# Patient Record
Sex: Female | Born: 1980
Health system: Southern US, Community
[De-identification: ages and names within clinical notes are randomized; demographics above are authoritative.]

## PROBLEM LIST (undated history)

## (undated) DIAGNOSIS — N970 Female infertility associated with anovulation: Secondary | ICD-10-CM

## (undated) DIAGNOSIS — E559 Vitamin D deficiency, unspecified: Secondary | ICD-10-CM

## (undated) DIAGNOSIS — E66813 Obesity, class 3: Secondary | ICD-10-CM

## (undated) DIAGNOSIS — Z8619 Personal history of other infectious and parasitic diseases: Secondary | ICD-10-CM

## (undated) DIAGNOSIS — R87629 Unspecified abnormal cytological findings in specimens from vagina: Secondary | ICD-10-CM

## (undated) DIAGNOSIS — R87612 Low grade squamous intraepithelial lesion on cytologic smear of cervix (LGSIL): Secondary | ICD-10-CM

## (undated) DIAGNOSIS — N6091 Unspecified benign mammary dysplasia of right breast: Secondary | ICD-10-CM

## (undated) HISTORY — DX: Personal history of other infectious and parasitic diseases: Z86.19

## (undated) HISTORY — DX: Unspecified abnormal cytological findings in specimens from vagina: R87.629

## (undated) HISTORY — DX: Morbid (severe) obesity due to excess calories: E66.01

## (undated) HISTORY — DX: Low grade squamous intraepithelial lesion on cytologic smear of cervix (LGSIL): R87.612

## (undated) HISTORY — DX: Unspecified benign mammary dysplasia of right breast: N60.91

## (undated) HISTORY — PX: BREAST BIOPSY: SHX20

## (undated) HISTORY — DX: Female infertility associated with anovulation: N97.0

## (undated) HISTORY — DX: Obesity, class 3: E66.813

## (undated) HISTORY — DX: Vitamin D deficiency, unspecified: E55.9

---

## 2000-07-04 HISTORY — PX: INDUCED ABORTION: SHX677

## 2003-06-23 ENCOUNTER — Other Ambulatory Visit: Admission: RE | Admit: 2003-06-23 | Discharge: 2003-06-23 | Payer: Self-pay | Admitting: Obstetrics and Gynecology

## 2004-08-05 ENCOUNTER — Other Ambulatory Visit: Admission: RE | Admit: 2004-08-05 | Discharge: 2004-08-05 | Payer: Self-pay | Admitting: Obstetrics and Gynecology

## 2005-09-01 ENCOUNTER — Other Ambulatory Visit: Admission: RE | Admit: 2005-09-01 | Discharge: 2005-09-01 | Payer: Self-pay | Admitting: Obstetrics and Gynecology

## 2011-07-05 DIAGNOSIS — R87612 Low grade squamous intraepithelial lesion on cytologic smear of cervix (LGSIL): Secondary | ICD-10-CM

## 2011-07-05 HISTORY — DX: Low grade squamous intraepithelial lesion on cytologic smear of cervix (LGSIL): R87.612

## 2011-09-12 LAB — HM PAP SMEAR

## 2012-09-03 LAB — HM PAP SMEAR: HM Pap smear: NORMAL

## 2013-05-07 LAB — COMPLETE METABOLIC PANEL WITH GFR
ALT: 14
AST: 26 U/L
Alkaline Phosphatase: 140 U/L
BILIRUBIN, TOTAL: 0.4
Creat: 75

## 2013-05-07 LAB — LIPID PANEL
Cholesterol: 203
HDL: 67 mg/dL (ref 35–70)
LDL (calc): 126
TRIGLYCERIDES: 52

## 2013-05-07 LAB — VITAMIN D 25 HYDROXY (VIT D DEFICIENCY, FRACTURES): VIT D 25 HYDROXY: 16.7

## 2013-05-07 LAB — CBC
HEMOGLOBIN: 12.4 g/dL
PLATELET COUNT: 300
WBC: 6.1

## 2013-05-07 LAB — TSH: TSH: 2.88

## 2013-05-07 LAB — HEMOGLOBIN A1C: A1C: 5.5

## 2014-07-04 HISTORY — PX: WISDOM TOOTH EXTRACTION: SHX21

## 2015-03-17 LAB — COMPLETE METABOLIC PANEL WITH GFR
ALK PHOS: 121 U/L
ALT: 10
AST: 14 U/L
Albumin: 3.9
BILIRUBIN, TOTAL: 0.4
Creat: 1.18
Glucose: 84
POTASSIUM: 4.3 mmol/L
Sodium: 140

## 2015-03-17 LAB — LIPID PANEL
CHOLESTEROL: 197
HDL: 51
LDL (calc): 136
TRIGLYCERIDES: 50

## 2015-03-17 LAB — INSULIN, RANDOM: INSULIN: 12.5

## 2015-03-17 LAB — TSH: TSH: 3.31

## 2015-03-17 LAB — TESTOSTERONE, FREE, DIRECT: TESTOSTERONE FREE: 1.9

## 2015-03-17 LAB — PROLACTIN: PROLACTIN: 7.8

## 2015-03-17 LAB — CBC
HEMOGLOBIN: 11.6 g/dL
WBC: 5.9
platelet count: 297

## 2015-03-17 LAB — GLUCOSE, FASTING: Glucose: 84

## 2015-04-23 LAB — CHLAMYDIA/GC NAA, CONFIRMATION
Chlamydia trachomatis, NAA: NEGATIVE
Neisseria gonorrhoeae, NAA: NEGATIVE

## 2015-04-23 LAB — HEPATITIS C ANTIBODY

## 2015-04-23 LAB — HIV GENOSURE REFLX (INTGR4): HIV 1&2 Ab, 4th Generation: NONREACTIVE

## 2015-04-23 LAB — RPR: RPR: NONREACTIVE

## 2015-06-30 LAB — FOLLICLE STIMULATING HORMONE: FSH: 72.7

## 2015-12-25 ENCOUNTER — Ambulatory Visit: Payer: Self-pay | Admitting: Family Medicine

## 2015-12-29 ENCOUNTER — Encounter (INDEPENDENT_AMBULATORY_CARE_PROVIDER_SITE_OTHER): Payer: Self-pay

## 2015-12-29 ENCOUNTER — Ambulatory Visit (INDEPENDENT_AMBULATORY_CARE_PROVIDER_SITE_OTHER): Payer: 59 | Admitting: Family Medicine

## 2015-12-29 ENCOUNTER — Encounter: Payer: Self-pay | Admitting: Family Medicine

## 2015-12-29 VITALS — BP 140/70 | HR 84 | Temp 98.3°F | Ht 64.5 in | Wt 278.0 lb

## 2015-12-29 DIAGNOSIS — R0683 Snoring: Secondary | ICD-10-CM | POA: Diagnosis not present

## 2015-12-29 DIAGNOSIS — G4733 Obstructive sleep apnea (adult) (pediatric): Secondary | ICD-10-CM | POA: Insufficient documentation

## 2015-12-29 DIAGNOSIS — Z0000 Encounter for general adult medical examination without abnormal findings: Secondary | ICD-10-CM | POA: Insufficient documentation

## 2015-12-29 DIAGNOSIS — Z9989 Dependence on other enabling machines and devices: Secondary | ICD-10-CM

## 2015-12-29 DIAGNOSIS — Z6841 Body Mass Index (BMI) 40.0 and over, adult: Secondary | ICD-10-CM | POA: Insufficient documentation

## 2015-12-29 HISTORY — DX: Obstructive sleep apnea (adult) (pediatric): G47.33

## 2015-12-29 NOTE — Assessment & Plan Note (Signed)
Preventative protocols reviewed and updated unless pt declined. Discussed healthy diet and lifestyle.  

## 2015-12-29 NOTE — Addendum Note (Signed)
Addended by: Eustaquio BoydenGUTIERREZ, Daryle Amis on: 12/29/2015 11:15 PM   Modules accepted: Orders

## 2015-12-29 NOTE — Progress Notes (Signed)
Pre visit review using our clinic review tool, if applicable. No additional management support is needed unless otherwise documented below in the visit note. 

## 2015-12-29 NOTE — Assessment & Plan Note (Addendum)
Discussed healthy diet and lifestyle changes to affect sustainable weight loss.  Pt motivated to continue healthy changes already implemented.  Requests nutritionist referral.  RTC 3 mo f/u visit.

## 2015-12-29 NOTE — Progress Notes (Addendum)
BP 140/70 mmHg  Pulse 84  Temp(Src) 98.3 F (36.8 C) (Oral)  Ht 5' 4.5" (1.638 m)  Wt 278 lb (126.1 kg)  BMI 47.00 kg/m2  SpO2 99%   CC: new pt to establish  Subjective:    Patient ID: Courtney Jones, female    DOB: 11/22/1980, 35 y.o.   MRN: [id number]  HPI: Courtney Jones is a 35 y.o. female presenting on 12/29/2015 for Establish Care   Obesity - working on weight loss. Cut out sodas over last 2 weeks. Working on more prepared meals at home. Working on portion sizes. Working out 3 times weekly at gym.   1 month ago completed abx for ear infection.   Occasional choking at night time. + snoring. + PNdyspnea. Overall restorative sleep. No significant daytime sleepiness.   LMP 04/2015. Very irregular. Prior on progesterone.   Preventative: Well woman - normal paps, h/o HPV. OBGYN Dr Senaida Oresichardson. 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 Interim medical history since our last visit reviewed. Allergies and medications reviewed and updated. No current outpatient prescriptions on file prior to visit.   No current facility-administered medications on file prior to visit.    Review of Systems  Constitutional: Negative for fever, chills, activity change, appetite change, fatigue and unexpected weight change.  HENT: Negative for hearing loss.   Eyes: Negative for visual disturbance.  Respiratory: Negative for cough, chest tightness, shortness of breath and wheezing.   Cardiovascular: Negative for chest pain, palpitations and leg swelling.  Gastrointestinal: Negative for nausea, vomiting, abdominal pain, diarrhea,  constipation, blood in stool and abdominal distention.  Genitourinary: Negative for hematuria and difficulty urinating.  Musculoskeletal: Negative for myalgias, arthralgias and neck pain.  Skin: Negative for rash.  Neurological: Negative for dizziness, seizures, syncope and headaches.  Hematological: Negative for adenopathy. Does not bruise/bleed easily.  Psychiatric/Behavioral: Negative for dysphoric mood. The patient is not nervous/anxious.    Per HPI unless specifically indicated in ROS section     Objective:    BP 140/70 mmHg  Pulse 84  Temp(Src) 98.3 F (36.8 C) (Oral)  Ht 5' 4.5" (1.638 m)  Wt 278 lb (126.1 kg)  BMI 47.00 kg/m2  SpO2 99%  Wt Readings from Last 3 Encounters:  12/29/15 278 lb (126.1 kg)    Physical Exam  Constitutional: She is oriented to person, place, and time. She appears well-developed and well-nourished. No distress.  HENT:  Head: Normocephalic and atraumatic.  Right Ear: Hearing, tympanic membrane, external ear and ear canal normal.  Left Ear: Hearing, tympanic membrane, external ear and ear canal normal.  Nose: Nose normal.  Mouth/Throat: Uvula is midline, oropharynx is clear and moist and mucous membranes are normal. No oropharyngeal exudate, posterior oropharyngeal edema or posterior oropharyngeal erythema.  Eyes: Conjunctivae and EOM are normal. Pupils are equal, round, and reactive to light. No scleral icterus.  Neck: Normal range of motion. Neck supple. No thyromegaly present.  Cardiovascular: Normal rate, regular rhythm, normal heart sounds and intact distal pulses.   No murmur heard. Pulses:      Radial pulses are 2+ on the right side, and 2+ on the left side.  Pulmonary/Chest: Effort  normal and breath sounds normal. No respiratory distress. She has no wheezes. She has no rales.  Abdominal: Soft. Bowel sounds are normal. She exhibits no distension and no mass. There is no tenderness. There is no rebound and no guarding.  Musculoskeletal:  Normal range of motion. She exhibits no edema.  Lymphadenopathy:    She has no cervical adenopathy.  Neurological: She is alert and oriented to person, place, and time.  CN grossly intact, station and gait intact  Skin: Skin is warm and dry. No rash noted.  Psychiatric: She has a normal mood and affect. Her behavior is normal. Judgment and thought content normal.  Nursing note and vitals reviewed.  No results found for this or any previous visit.    Assessment & Plan:   Problem List Items Addressed This Visit    Obesity, Class III, BMI 40-49.9 (morbid obesity) (HCC)    Discussed healthy diet and lifestyle changes to affect sustainable weight loss.  Pt motivated to continue healthy changes already implemented.  Requests nutritionist referral.  RTC 3 mo f/u visit.       Relevant Orders   Amb ref to Medical Nutrition Therapy-MNT   Lipid panel   Comprehensive metabolic panel   TSH   Health maintenance examination - Primary    Preventative protocols reviewed and updated unless pt declined. Discussed healthy diet and lifestyle.       Snoring    ESS today = 13. Will refer to pulm to discuss possible OSA.  Pt motivated to make healthy changes for weight loss.       Relevant Orders   Ambulatory referral to Pulmonology       Follow up plan: Return in about 3 months (around 03/30/2016) for follow up visit.  Eustaquio BoydenJavier Santia Labate, MD

## 2015-12-29 NOTE — Patient Instructions (Addendum)
Look into Center for [facility] in [place] [phone] You are doing well today. Continue healthy diet and lifestyle changes. Return as needed or in 3 months for follow up visit.  Return at your convenience for fasting labs one morning.   Health Maintenance, Female Adopting a healthy lifestyle and getting preventive care can go a long way to promote health and wellness. Talk with your health care provider about what schedule of regular examinations is right for you. This is a good chance for you to check in with your provider about disease prevention and staying healthy. In between checkups, there are plenty of things you can do on your own. Experts have done a lot of research about which lifestyle changes and preventive measures are most likely to keep you healthy. Ask your health care provider for more information. WEIGHT AND DIET  Eat a healthy diet  Be sure to include plenty of vegetables, fruits, low-fat dairy products, and lean protein.  Do not eat a lot of foods high in solid fats, added sugars, or salt.  Get regular exercise. This is one of the most important things you can do for your health.  Most adults should exercise for at least 150 minutes each week. The exercise should increase your heart rate and make you sweat (moderate-intensity exercise).  Most adults should also do strengthening exercises at least twice a week. This is in addition to the moderate-intensity exercise.  Maintain a healthy weight  Body mass index (BMI) is a measurement that can be used to identify possible weight problems. It estimates body fat based on height and weight. Your health care provider can help determine your BMI and help you achieve or maintain a healthy weight.  For females 54 years of age and older:   A BMI below 18.5 is considered underweight.  A BMI of 18.5 to 24.9 is normal.  A BMI of 25 to 29.9 is considered overweight.  A BMI of 30 and above is considered obese.  Watch  levels of cholesterol and blood lipids  You should start having your blood tested for lipids and cholesterol at 36 years of age, then have this test every 5 years.  You may need to have your cholesterol levels checked more often if:  Your lipid or cholesterol levels are high.  You are older than 36 years of age.  You are at high risk for heart disease.  CANCER SCREENING   Lung Cancer  Lung cancer screening is recommended for adults 81-59 years old who are at high risk for lung cancer because of a history of smoking.  A yearly low-dose CT scan of the lungs is recommended for people who:  Currently smoke.  Have quit within the past 15 years.  Have at least a 30-pack-year history of smoking. A pack year is smoking an average of one pack of cigarettes a day for 1 year.  Yearly screening should continue until it has been 15 years since you quit.  Yearly screening should stop if you develop a health problem that would prevent you from having lung cancer treatment.  Breast Cancer  Practice breast self-awareness. This means understanding how your breasts normally appear and feel.  It also means doing regular breast self-exams. Let your health care provider know about any changes, no matter how small.  If you are in your 20s or 30s, you should have a clinical breast exam (CBE) by a health care provider every 1-3 years as part of a regular health  exam.  If you are 40 or older, have a CBE every year. Also consider having a breast X-ray (mammogram) every year.  If you have a family history of breast cancer, talk to your health care provider about genetic screening.  If you are at high risk for breast cancer, talk to your health care provider about having an MRI and a mammogram every year.  Breast cancer gene (BRCA) assessment is recommended for women who have family members with BRCA-related cancers. BRCA-related cancers include:  Breast.  Ovarian.  Tubal.  Peritoneal  cancers.  Results of the assessment will determine the need for genetic counseling and BRCA1 and BRCA2 testing. Cervical Cancer Your health care provider may recommend that you be screened regularly for cancer of the pelvic organs (ovaries, uterus, and vagina). This screening involves a pelvic examination, including checking for microscopic changes to the surface of your cervix (Pap test). You may be encouraged to have this screening done every 3 years, beginning at age 41.  For women ages 38-65, health care providers may recommend pelvic exams and Pap testing every 3 years, or they may recommend the Pap and pelvic exam, combined with testing for human papilloma virus (HPV), every 5 years. Some types of HPV increase your risk of cervical cancer. Testing for HPV may also be done on women of any age with unclear Pap test results.  Other health care providers may not recommend any screening for nonpregnant women who are considered low risk for pelvic cancer and who do not have symptoms. Ask your health care provider if a screening pelvic exam is right for you.  If you have had past treatment for cervical cancer or a condition that could lead to cancer, you need Pap tests and screening for cancer for at least 20 years after your treatment. If Pap tests have been discontinued, your risk factors (such as having a new sexual partner) need to be reassessed to determine if screening should resume. Some women have medical problems that increase the chance of getting cervical cancer. In these cases, your health care provider may recommend more frequent screening and Pap tests. Colorectal Cancer  This type of cancer can be detected and often prevented.  Routine colorectal cancer screening usually begins at 36 years of age and continues through 36 years of age.  Your health care provider may recommend screening at an earlier age if you have risk factors for colon cancer.  Your health care provider may also  recommend using home test kits to check for hidden blood in the stool.  A small camera at the end of a tube can be used to examine your colon directly (sigmoidoscopy or colonoscopy). This is done to check for the earliest forms of colorectal cancer.  Routine screening usually begins at age 10.  Direct examination of the colon should be repeated every 5-10 years through 36 years of age. However, you may need to be screened more often if early forms of precancerous polyps or small growths are found. Skin Cancer  Check your skin from head to toe regularly.  Tell your health care provider about any new moles or changes in moles, especially if there is a change in a mole's shape or color.  Also tell your health care provider if you have a mole that is larger than the size of a pencil eraser.  Always use sunscreen. Apply sunscreen liberally and repeatedly throughout the day.  Protect yourself by wearing long sleeves, pants, a wide-brimmed hat, and sunglasses  whenever you are outside. HEART DISEASE, DIABETES, AND HIGH BLOOD PRESSURE   High blood pressure causes heart disease and increases the risk of stroke. High blood pressure is more likely to develop in:  People who have blood pressure in the high end of the normal range (130-139/85-89 mm Hg).  People who are overweight or obese.  People who are African American.  If you are 47-41 years of age, have your blood pressure checked every 3-5 years. If you are 33 years of age or older, have your blood pressure checked every year. You should have your blood pressure measured twice--once when you are at a hospital or clinic, and once when you are not at a hospital or clinic. Record the average of the two measurements. To check your blood pressure when you are not at a hospital or clinic, you can use:  An automated blood pressure machine at a pharmacy.  A home blood pressure monitor.  If you are between 17 years and 73 years old, ask your health  care provider if you should take aspirin to prevent strokes.  Have regular diabetes screenings. This involves taking a blood sample to check your fasting blood sugar level.  If you are at a normal weight and have a low risk for diabetes, have this test once every three years after 36 years of age.  If you are overweight and have a high risk for diabetes, consider being tested at a younger age or more often. PREVENTING INFECTION  Hepatitis B  If you have a higher risk for hepatitis B, you should be screened for this virus. You are considered at high risk for hepatitis B if:  You were born in a country where hepatitis B is common. Ask your health care provider which countries are considered high risk.  Your parents were born in a high-risk country, and you have not been immunized against hepatitis B (hepatitis B vaccine).  You have HIV or AIDS.  You use needles to inject street drugs.  You live with someone who has hepatitis B.  You have had sex with someone who has hepatitis B.  You get hemodialysis treatment.  You take certain medicines for conditions, including cancer, organ transplantation, and autoimmune conditions. Hepatitis C  Blood testing is recommended for:  Everyone born from [birth year] through 03/14/1965.  Anyone with known risk factors for hepatitis C. Sexually transmitted infections (STIs)  You should be screened for sexually transmitted infections (STIs) including gonorrhea and chlamydia if:  You are sexually active and are younger than 36 years of age.  You are older than 36 years of age and your health care provider tells you that you are at risk for this type of infection.  Your sexual activity has changed since you were last screened and you are at an increased risk for chlamydia or gonorrhea. Ask your health care provider if you are at risk.  If you do not have HIV, but are at risk, it may be recommended that you take a prescription medicine daily to prevent HIV  infection. This is called pre-exposure prophylaxis (PrEP). You are considered at risk if:  You are sexually active and do not regularly use condoms or know the HIV status of your partner(s).  You take drugs by injection.  You are sexually active with a partner who has HIV. Talk with your health care provider about whether you are at high risk of being infected with HIV. If you choose to begin PrEP, you should first be  tested for HIV. You should then be tested every 3 months for as long as you are taking PrEP.  PREGNANCY   If you are premenopausal and you may become pregnant, ask your health care provider about preconception counseling.  If you may become pregnant, take 400 to 800 micrograms (mcg) of folic acid every day.  If you want to prevent pregnancy, talk to your health care provider about birth control (contraception). OSTEOPOROSIS AND MENOPAUSE   Osteoporosis is a disease in which the bones lose minerals and strength with aging. This can result in serious bone fractures. Your risk for osteoporosis can be identified using a bone density scan.  If you are 25 years of age or older, or if you are at risk for osteoporosis and fractures, ask your health care provider if you should be screened.  Ask your health care provider whether you should take a calcium or vitamin D supplement to lower your risk for osteoporosis.  Menopause may have certain physical symptoms and risks.  Hormone replacement therapy may reduce some of these symptoms and risks. Talk to your health care provider about whether hormone replacement therapy is right for you.  HOME CARE INSTRUCTIONS   Schedule regular health, dental, and eye exams.  Stay current with your immunizations.   Do not use any tobacco products including cigarettes, chewing tobacco, or electronic cigarettes.  If you are pregnant, do not drink alcohol.  If you are breastfeeding, limit how much and how often you drink alcohol.  Limit  alcohol intake to no more than 1 drink per day for nonpregnant women. One drink equals 12 ounces of beer, 5 ounces of wine, or 1 ounces of hard liquor.  Do not use street drugs.  Do not share needles.  Ask your health care provider for help if you need support or information about quitting drugs.  Tell your health care provider if you often feel depressed.  Tell your health care provider if you have ever been abused or do not feel safe at home.   This information is not intended to replace advice given to you by your health care provider. Make sure you discuss any questions you have with your health care provider.   Document Released: 01/03/2011 Document Revised: 07/11/2014 Document Reviewed: 05/22/2013 Elsevier Interactive Patient Education [employer].

## 2015-12-29 NOTE — Assessment & Plan Note (Signed)
ESS today = 13. Will refer to pulm to discuss possible OSA.  Pt motivated to make healthy changes for weight loss.

## 2015-12-31 ENCOUNTER — Other Ambulatory Visit (INDEPENDENT_AMBULATORY_CARE_PROVIDER_SITE_OTHER): Payer: 59

## 2015-12-31 LAB — COMPREHENSIVE METABOLIC PANEL
ALK PHOS: 94 U/L (ref 39–117)
ALT: 17 U/L (ref 0–35)
AST: 23 U/L (ref 0–37)
Albumin: 3.9 g/dL (ref 3.5–5.2)
BUN: 16 mg/dL (ref 6–23)
CHLORIDE: 104 meq/L (ref 96–112)
CO2: 30 meq/L (ref 19–32)
Calcium: 9.6 mg/dL (ref 8.4–10.5)
Creatinine, Ser: 1.29 mg/dL — ABNORMAL HIGH (ref 0.40–1.20)
GFR: 49.61 mL/min — AB (ref >60.00)
GLUCOSE: 83 mg/dL (ref 70–99)
POTASSIUM: 4.1 meq/L (ref 3.5–5.1)
SODIUM: 139 meq/L (ref 135–145)
TOTAL PROTEIN: 7.9 g/dL (ref 6.0–8.3)
Total Bilirubin: 0.4 mg/dL (ref 0.2–1.2)

## 2015-12-31 LAB — LIPID PANEL
CHOL/HDL RATIO: 3
Cholesterol: 189 mg/dL (ref 0–200)
HDL: 55.6 mg/dL (ref >39.00)
LDL CALC: 125 mg/dL — AB (ref 0–99)
NONHDL: 133.34
Triglycerides: 42 mg/dL (ref 0.0–149.0)
VLDL: 8.4 mg/dL (ref 0.0–40.0)

## 2015-12-31 LAB — TSH: TSH: 2.49 u[IU]/mL (ref 0.35–4.50)

## 2016-01-03 ENCOUNTER — Encounter: Payer: Self-pay | Admitting: Family Medicine

## 2016-01-06 ENCOUNTER — Encounter: Payer: Self-pay | Admitting: *Deleted

## 2016-01-10 ENCOUNTER — Encounter: Payer: Self-pay | Admitting: Family Medicine

## 2016-01-10 DIAGNOSIS — E559 Vitamin D deficiency, unspecified: Secondary | ICD-10-CM | POA: Insufficient documentation

## 2016-01-29 ENCOUNTER — Encounter: Payer: Self-pay | Admitting: Pulmonary Disease

## 2016-01-29 ENCOUNTER — Ambulatory Visit (INDEPENDENT_AMBULATORY_CARE_PROVIDER_SITE_OTHER): Payer: 59 | Admitting: Pulmonary Disease

## 2016-01-29 VITALS — BP 118/78 | HR 98 | Ht 64.5 in | Wt 272.0 lb

## 2016-01-29 DIAGNOSIS — G4719 Other hypersomnia: Secondary | ICD-10-CM

## 2016-01-29 DIAGNOSIS — E669 Obesity, unspecified: Secondary | ICD-10-CM | POA: Diagnosis not present

## 2016-01-29 DIAGNOSIS — G4710 Hypersomnia, unspecified: Secondary | ICD-10-CM

## 2016-01-29 NOTE — Progress Notes (Signed)
PULMONARY CONSULT NOTE  Requesting MD/Service: Guitierrez Date of initial consultation: 01/29/16 Reason for consultation: snoring, hypersomnolence  PT PROFILE: 35 F with obesity, snoring, daytime hypersomnolence.    HPI:  Very pleasant 35 F with obesity heavy snoring, occasional awakening with gasping and daytime hypersomnolence. Scored 13 on Epworth Scale. Most significantly, she gets very drowsy while driving. She has never had an auto accident or any near misses due to falling asleep. Currently weighs 272 lb. Peak weight was 278. @ age 5, she weighed approx 150 # and @ age 9, approx 200 #. She   Past Medical History:  Diagnosis Date  . History of chickenpox   . History of trichomonal vaginitis   . Infertility associated with anovulation    elevated FSH Senaida Ores)  . Low grade squamous intraepithelial lesion (LGSIL) on Papanicolaou smear of cervix 2013   s/p colposcopy  . Obesity, Class III, BMI 40-49.9 (morbid obesity) (HCC)   . Vitamin D deficiency     Past Surgical History:  Procedure Laterality Date  . INDUCED ABORTION  2002  . WISDOM TOOTH EXTRACTION  2016    MEDICATIONS: I have reviewed all medications and confirmed regimen as documented  Social History   Social History  . Marital status: Single    Spouse name: N/A  . Number of children: N/A  . Years of education: N/A   Occupational History  . Not on file.   Social History Main Topics  . Smoking status: Never Smoker  . Smokeless tobacco: Never Used  . Alcohol use 0.0 oz/week     Comment: occ  . Drug use: No  . Sexual activity: Not on file   Other Topics Concern  . Not on file   Social History Narrative   Lives alone, no pets   Edu: college Chemical engineer)   Occupation: Interior and spatial designer   Activity: gym 3 times a week   Diet: healthy diet changes, good water, fruits/vegetables daily    Family History  Problem Relation Age of Onset  . CAD Father 56    MI  . Breast  cancer Maternal Grandmother 59  . Rheum arthritis Mother   . Stroke Neg Hx   . Diabetes Neg Hx     ROS: No fever, myalgias/arthralgias, unexplained weight loss or weight gain No new focal weakness or sensory deficits No otalgia, hearing loss, visual changes, nasal and sinus symptoms, mouth and throat problems No neck pain or adenopathy No abdominal pain, N/V/D, diarrhea, change in bowel pattern No dysuria, change in urinary pattern   There were no vitals filed for this visit. The vitals are not loading. They have been reviewed and are documented  EXAM:  Gen: Obese, NAD HEENT: NCAT, sclera white, oropharynx - small pharyngeal orifice, nares normal Neck: Supple without LAN, thyromegaly, JVD Lungs: breath sounds: full, percussion: normal, No adventitious sounds Cardiovascular: RRR, no murmurs noted Abdomen: Soft, nontender, normal BS Ext: without clubbing, cyanosis, edema Neuro: CNs grossly intact, motor and sensory intact Skin: Limited exam, no lesions noted  DATA:   BMP Latest Ref Rng & Units 12/31/2015 03/17/2015  Glucose 70 - 99 mg/dL 83 -  BUN 6 - 23 mg/dL 16 -  Creatinine 1.61 - 1.20 mg/dL 0.96(E) 4.54  Sodium 098 - 145 mEq/L 139 140  Potassium 3.5 - 5.1 mEq/L 4.1 4.3  Chloride 96 - 112 mEq/L 104 -  CO2 19 - 32 mEq/L 30 -  Calcium 8.4 - 10.5 mg/dL 9.6 -  CBC Latest Ref Rng & Units 03/17/2015  WBC - 5.9  Hemoglobin g/dL 96.0    CXR:  None available  IMPRESSION:     ICD-9-CM ICD-10-CM   1. Excessive daytime sleepiness 780.54 G47.19 Home sleep test   Heavy dnoring Obesity Likely OSA  PLAN:  Home sleep study ordered.  Based on results, will schedule titration study or auto-titration and initiate CPAP as indicated ROV will be scheduled when results of sleep study are available I spent 15 mins discussing the likely dx of OSA and its implications We also discussed the importance of weight loss  Billy Fischer, MD PCCM service Mobile [phone] Pager  [phone] 01/29/2016

## 2016-02-01 ENCOUNTER — Encounter: Payer: Self-pay | Admitting: Dietician

## 2016-02-01 ENCOUNTER — Encounter: Payer: 59 | Attending: Family Medicine | Admitting: Dietician

## 2016-02-01 VITALS — Ht 64.5 in | Wt 271.5 lb

## 2016-02-01 DIAGNOSIS — E669 Obesity, unspecified: Secondary | ICD-10-CM | POA: Diagnosis not present

## 2016-02-01 NOTE — Progress Notes (Signed)
Medical Nutrition Therapy: Visit start time: 1530  end time: 1630 Assessment:  Diagnosis: obesity Psychosocial issues/ stress concerns: none identified  Current weight: 271.5 lbs  Height: 64.5 in Medications, supplements: takes a calcium with Vit.D supplement Progress and evaluation:  Patient in for initial medical nutrition therapy assessment. She reports she has been making positive diet and exercise changes for 1 month. She has a documented weight loss of 6.5 lbs in the past month. She reports that 278 lbs is her highest weight although she has had weight issues for most of her life. She does not have a history of "dieting" She has eliminated fried foods and soda and has increased her fruit and vegetable intake. Her main beverage is water and she drinks at least 8 cups per day.  She is preparing more meals at home and eats "out" or take out only 1-2 x/week. Her present diet is low in whole grains and calcium sources but she takes a calcium/Vit. D supplement.  Physical activity: gym- 3 days per week; 40  Minutes weight lifting, 20 minutes cardio  Dietary Intake:  Usual eating pattern includes 3  meals and 2  snacks per day. Dining out frequency: 2 meals per week.  Breakfast: 7:00am- egg whites with spinach, mushrooms or English muffin with peanut butter,  Snack: fruit Lunch: 12:00- typically leftovers from dinner Snack: fruit Supper: 6:00pm- stirfry chicken with broccoli, rice or lasagne made with zucchini with noodles or baked chicken and roasted vegetables Beverages: water, half/half sweet/unsweet tea when eating "out"  Nutrition Care Education:  Weight control Commended patient on the positive diet and exercise changes she has made thus far. Discussed importance of mindful eating verses restrictive dieting and how she is overall doing a nice job of balancing protein, carbohydrate and non-starchy vegetables. Used food guide plate to further discuss and show examples.  Gave and reviewed  "Planning a Balanced Meal" to help know how to estimate carbohydrate and protein servings to meet nutrient needs based on an 1800 calorie diet.   Nutritional Diagnosis:  [place]-3.3 Overweight/obesity As related to history of intake of fried foods, sodas, "fast foods".  As evidenced by diet history..  Intervention:  Balance meals with 2-4 oz. Of lean protein, 2-4 servings of carbohydrate and "free vegetables". Increase exercise to 5 days per week.  Try a margarine with no trans fats such as "I Can't Believe it's not butter" lite margarine. Measure some portions of starchy foods.  Education Materials given:  . Food lists/ Planning A Balanced Meal . Sample meal pattern/ menus . Goals/ instructions Learner/ who was taught:  . Patient  Level of understanding: Marland Kitchen Verbalizes understanding Learning barriers: . None Willingness to learn/ readiness for change: . Eager, change in progress Monitoring and Evaluation:  Dietary intake, exercise, , and body weight     Follow-up: 03/08/16

## 2016-02-01 NOTE — Patient Instructions (Signed)
Balance meals with 2-4 oz. Of lean protein, 2-4 servings of carbohydrate and "free vegetables". Increase exercise to 5 days per week.  Try a margarine with no trans fats such as "I Can't Believe it's not butter" lite margarine. Measure some portions of starchy foods.

## 2016-02-03 ENCOUNTER — Encounter: Payer: Self-pay | Admitting: Family Medicine

## 2016-02-03 NOTE — Progress Notes (Signed)
xxx

## 2016-02-15 DIAGNOSIS — G4733 Obstructive sleep apnea (adult) (pediatric): Secondary | ICD-10-CM | POA: Diagnosis not present

## 2016-02-18 DIAGNOSIS — G4733 Obstructive sleep apnea (adult) (pediatric): Secondary | ICD-10-CM | POA: Diagnosis not present

## 2016-02-19 ENCOUNTER — Other Ambulatory Visit: Payer: Self-pay | Admitting: *Deleted

## 2016-02-19 DIAGNOSIS — G4719 Other hypersomnia: Secondary | ICD-10-CM

## 2016-02-24 ENCOUNTER — Telehealth: Payer: Self-pay | Admitting: *Deleted

## 2016-02-24 ENCOUNTER — Encounter: Payer: Self-pay | Admitting: *Deleted

## 2016-02-24 DIAGNOSIS — G4733 Obstructive sleep apnea (adult) (pediatric): Secondary | ICD-10-CM

## 2016-02-24 NOTE — Telephone Encounter (Signed)
Per DS, set pt up on CPAP auto 5-15 and f/u in 1 month.  LMOM for pt to return call for results.

## 2016-02-24 NOTE — Telephone Encounter (Signed)
Spoke with pt and informed the need for her CPAP. Pt scheduled f/u appt. Order placed for new CPAP. Nothing further needed.

## 2016-03-08 ENCOUNTER — Ambulatory Visit: Payer: 59 | Admitting: Dietician

## 2016-03-24 ENCOUNTER — Ambulatory Visit: Payer: 59 | Admitting: Pulmonary Disease

## 2016-03-29 ENCOUNTER — Other Ambulatory Visit: Payer: Self-pay | Admitting: Family Medicine

## 2016-03-29 DIAGNOSIS — N289 Disorder of kidney and ureter, unspecified: Secondary | ICD-10-CM

## 2016-03-31 ENCOUNTER — Other Ambulatory Visit (INDEPENDENT_AMBULATORY_CARE_PROVIDER_SITE_OTHER): Payer: 59

## 2016-03-31 DIAGNOSIS — N289 Disorder of kidney and ureter, unspecified: Secondary | ICD-10-CM | POA: Diagnosis not present

## 2016-04-01 LAB — CBC WITH DIFFERENTIAL/PLATELET
BASOS PCT: 0.8 % (ref 0.0–3.0)
Basophils Absolute: 0.1 10*3/uL (ref 0.0–0.1)
EOS PCT: 1.1 % (ref 0.0–5.0)
Eosinophils Absolute: 0.1 10*3/uL (ref 0.0–0.7)
HCT: 35 % — ABNORMAL LOW (ref 36.0–46.0)
Hemoglobin: 11.7 g/dL — ABNORMAL LOW (ref 12.0–15.0)
LYMPHS ABS: 2.2 10*3/uL (ref 0.7–4.0)
Lymphocytes Relative: 34.1 % (ref 12.0–46.0)
MCHC: 33.5 g/dL (ref 30.0–36.0)
MCV: 79.2 fl (ref 78.0–100.0)
MONO ABS: 0.5 10*3/uL (ref 0.1–1.0)
Monocytes Relative: 7.5 % (ref 3.0–12.0)
NEUTROS ABS: 3.6 10*3/uL (ref 1.4–7.7)
NEUTROS PCT: 56.5 % (ref 43.0–77.0)
PLATELETS: 261 10*3/uL (ref 150.0–400.0)
RBC: 4.42 Mil/uL (ref 3.87–5.11)
RDW: 16.8 % — AB (ref 11.5–15.5)
WBC: 6.4 10*3/uL (ref 4.0–10.5)

## 2016-04-01 LAB — RENAL FUNCTION PANEL
ALBUMIN: 3.6 g/dL (ref 3.5–5.2)
BUN: 15 mg/dL (ref 6–23)
CALCIUM: 9 mg/dL (ref 8.4–10.5)
CO2: 28 mEq/L (ref 19–32)
Chloride: 103 mEq/L (ref 96–112)
Creatinine, Ser: 1.22 mg/dL — ABNORMAL HIGH (ref 0.40–1.20)
GFR: 52.84 mL/min — ABNORMAL LOW (ref >60.00)
GLUCOSE: 75 mg/dL (ref 70–99)
POTASSIUM: 4.5 meq/L (ref 3.5–5.1)
Phosphorus: 3.6 mg/dL (ref 2.3–4.6)
Sodium: 136 mEq/L (ref 135–145)

## 2016-04-01 LAB — MICROALBUMIN / CREATININE URINE RATIO
CREATININE, U: 138.5 mg/dL
Microalb Creat Ratio: 0.5 mg/g (ref 0.0–30.0)

## 2016-04-01 LAB — VITAMIN D 25 HYDROXY (VIT D DEFICIENCY, FRACTURES): VITD: 13.43 ng/mL — AB (ref 30.00–100.00)

## 2016-04-04 ENCOUNTER — Encounter: Payer: Self-pay | Admitting: Family Medicine

## 2016-04-04 ENCOUNTER — Ambulatory Visit (INDEPENDENT_AMBULATORY_CARE_PROVIDER_SITE_OTHER): Payer: 59 | Admitting: Family Medicine

## 2016-04-04 VITALS — BP 124/80 | HR 76 | Temp 98.1°F | Wt 274.5 lb

## 2016-04-04 DIAGNOSIS — D649 Anemia, unspecified: Secondary | ICD-10-CM | POA: Insufficient documentation

## 2016-04-04 DIAGNOSIS — G4733 Obstructive sleep apnea (adult) (pediatric): Secondary | ICD-10-CM | POA: Diagnosis not present

## 2016-04-04 DIAGNOSIS — Z9989 Dependence on other enabling machines and devices: Secondary | ICD-10-CM

## 2016-04-04 DIAGNOSIS — N289 Disorder of kidney and ureter, unspecified: Secondary | ICD-10-CM | POA: Diagnosis not present

## 2016-04-04 DIAGNOSIS — N183 Chronic kidney disease, stage 3 unspecified: Secondary | ICD-10-CM

## 2016-04-04 DIAGNOSIS — D631 Anemia in chronic kidney disease: Secondary | ICD-10-CM

## 2016-04-04 DIAGNOSIS — E559 Vitamin D deficiency, unspecified: Secondary | ICD-10-CM | POA: Diagnosis not present

## 2016-04-04 HISTORY — DX: Chronic kidney disease, stage 3 unspecified: N18.30

## 2016-04-04 LAB — POC URINALSYSI DIPSTICK (AUTOMATED)
Bilirubin, UA: NEGATIVE
Blood, UA: NEGATIVE
Glucose, UA: NEGATIVE
KETONES UA: NEGATIVE
LEUKOCYTES UA: NEGATIVE
NITRITE UA: NEGATIVE
PROTEIN UA: NEGATIVE
Spec Grav, UA: >=1.03
UROBILINOGEN UA: 0.2
pH, UA: 6

## 2016-04-04 MED ORDER — VITAMIN D3 1.25 MG (50000 UT) PO TABS: 1.0000 | ORAL_TABLET | ORAL | 1 refills | Status: DC

## 2016-04-04 NOTE — Assessment & Plan Note (Signed)
Anticipate anemia related to CKD. Check anemia panel next labwork.

## 2016-04-04 NOTE — Assessment & Plan Note (Signed)
Treat with vit D 50,000 IU weekly x 3 months then reassess.

## 2016-04-04 NOTE — Assessment & Plan Note (Signed)
Labs consistent with CKD stage 3 - discussed with patient. Will check renal US.

## 2016-04-04 NOTE — Progress Notes (Signed)
BP 124/80   Pulse 76   Temp 98.1 F (36.7 C) (Oral)   Wt 274 lb 8 oz (124.5 kg)   BMI 46.39 kg/m    CC: 3 mo f/u visit Subjective:    Patient ID: Courtney Jones, female    DOB: [birth year], 35 y.o.   MRN: [id number]  HPI: Courtney Jones is a 35 y.o. female presenting on 04/04/2016 for Follow-up   Referred to pulm for OSA eval - CPAP started last month. Pending f/u next month.  Feels she's sleeping better with more restorative sleeping at night time.   Obesity - established with nutritionist. Working on weekly meal planning on sundays.  CKD stage 3 - no known personal or family history of kidney trouble in family. No h/o kidney stones.   Relevant past medical, surgical, family and social history reviewed and updated as indicated. Interim medical history since our last visit reviewed. Allergies and medications reviewed and updated. No current outpatient prescriptions on file prior to visit.   No current facility-administered medications on file prior to visit.     Review of Systems Per HPI unless specifically indicated in ROS section     Objective:    BP 124/80   Pulse 76   Temp 98.1 F (36.7 C) (Oral)   Wt 274 lb 8 oz (124.5 kg)   BMI 46.39 kg/m   Wt Readings from Last 3 Encounters:  04/04/16 274 lb 8 oz (124.5 kg)  02/01/16 271 lb 8 oz (123.2 kg)  01/29/16 272 lb (123.4 kg)    Physical Exam  Constitutional: She appears well-developed and well-nourished. No distress.  Musculoskeletal: She exhibits no edema.  Skin: Skin is warm and dry. No rash noted.  Psychiatric: She has a normal mood and affect.  Nursing note and vitals reviewed.  Results for orders placed or performed in visit on 04/04/16  POCT Urinalysis Dipstick (Automated)  Result Value Ref Range   Color, UA Yellow    Clarity, UA Clear    Glucose, UA Negative    Bilirubin, UA Negative    Ketones, UA Negative    Spec Grav, UA >=1.030    Blood, UA Negative    pH, UA 6.0    Protein, UA Negative    Urobilinogen, UA 0.2    Nitrite, UA Negative    Leukocytes, UA Negative Negative      Assessment & Plan:   Problem List Items Addressed This Visit    Anemia    Anticipate anemia related to CKD. Check anemia panel next labwork.       Relevant Orders   IBC panel   Vitamin B12   Ferritin   Folate   Obesity, Class III, BMI 40-49.9 (morbid obesity) (HCC)    Pt saw nutritionist, working on healthy meal planning.       OSA on CPAP    CPAP recently started. Appreciate pulm assistance with patient.       Renal insufficiency - Primary    Labs consistent with CKD stage 3 - discussed with patient. Will check renal US.      Relevant Orders   US Renal   POCT Urinalysis Dipstick (Automated) (Completed)   Renal function panel   Parathyroid hormone, intact (no Ca)   Vitamin D deficiency    Treat with vit D 50,000 IU weekly x 3 months then reassess.      Relevant Orders   VITAMIN D 25 Hydroxy (Vit-D Deficiency, Fractures)    Other Visit Diagnoses  None.      Follow up plan: Return in about 6 months (around 10/03/2016), or if symptoms worsen or fail to improve, for follow up visit.  Eustaquio BoydenJavier Meaghen Vecchiarelli, MD

## 2016-04-04 NOTE — Assessment & Plan Note (Signed)
Pt saw nutritionist, working on healthy meal planning.

## 2016-04-04 NOTE — Patient Instructions (Addendum)
Labs show slight anemia and mild kidney impairment.  We will check kidney ultrasound to further evaluate this as well as have you return in 3 months for lab visit only to recheck blood counts and kidney levels.  Restart vitamin D 50,000 units weekly.  Urinalysis today.

## 2016-04-04 NOTE — Assessment & Plan Note (Signed)
CPAP recently started. Appreciate pulm assistance with patient.

## 2016-04-04 NOTE — Progress Notes (Signed)
Pre visit review using our clinic review tool, if applicable. No additional management support is needed unless otherwise documented below in the visit note. 

## 2016-04-08 ENCOUNTER — Encounter: Payer: Self-pay | Admitting: Dietician

## 2016-04-08 ENCOUNTER — Ambulatory Visit
Admission: RE | Admit: 2016-04-08 | Discharge: 2016-04-08 | Disposition: A | Discharge status: Home / Self Care | Payer: 59 | Source: Ambulatory Visit | Attending: Family Medicine | Admitting: Family Medicine

## 2016-04-08 DIAGNOSIS — N289 Disorder of kidney and ureter, unspecified: Secondary | ICD-10-CM

## 2016-04-14 ENCOUNTER — Ambulatory Visit: Payer: 59 | Admitting: Pulmonary Disease

## 2016-04-25 ENCOUNTER — Ambulatory Visit (INDEPENDENT_AMBULATORY_CARE_PROVIDER_SITE_OTHER): Payer: 59 | Admitting: Obstetrics & Gynecology

## 2016-04-25 ENCOUNTER — Encounter: Payer: Self-pay | Admitting: Obstetrics & Gynecology

## 2016-04-25 VITALS — BP 114/79 | HR 80 | Ht 65.0 in | Wt 275.0 lb

## 2016-04-25 DIAGNOSIS — Z124 Encounter for screening for malignant neoplasm of cervix: Secondary | ICD-10-CM

## 2016-04-25 DIAGNOSIS — N912 Amenorrhea, unspecified: Secondary | ICD-10-CM

## 2016-04-25 DIAGNOSIS — Z1151 Encounter for screening for human papillomavirus (HPV): Secondary | ICD-10-CM | POA: Diagnosis not present

## 2016-04-25 DIAGNOSIS — Z01419 Encounter for gynecological examination (general) (routine) without abnormal findings: Secondary | ICD-10-CM | POA: Diagnosis not present

## 2016-04-25 HISTORY — DX: Amenorrhea, unspecified: N91.2

## 2016-04-25 NOTE — Progress Notes (Signed)
GYNECOLOGY ANNUAL PREVENTATIVE CARE ENCOUNTER NOTE  Subjective:   Courtney Jones is a 35 y.o. [id number] female here for a routine annual gynecologic exam.  Current complaints: amenorrhea and infertility for over one year. Has undergone evaluation in past, found to elevated FSH consistent with premature ovarian failure, referred to REI as she is interested in getting pregnant.   Denies abnormal vaginal bleeding, discharge, pelvic pain, problems with intercourse or other gynecologic concerns.    Gynecologic History No LMP recorded. Patient is not currently having periods (Reason: Perimenopausal). Contraception: none Last Pap: 2014. Results were: normal  Obstetric History OB History  Gravida Para Term Preterm AB Living  1       1    SAB TAB Ectopic Multiple Live Births    1          # Outcome Date GA Lbr Len/2nd Weight Sex Delivery Anes PTL Lv  1 TAB               Past Medical History:  Diagnosis Date  . History of chickenpox   . History of trichomonal vaginitis   . Infertility associated with anovulation    elevated FSH Senaida Ores([place])  . Low grade squamous intraepithelial lesion (LGSIL) on Papanicolaou smear of cervix 2013   s/p colposcopy  . Obesity, Class III, BMI 40-49.9 (morbid obesity) (HCC)    saw nutritionist 01/2016  . Vaginal Pap smear, abnormal   . Vitamin D deficiency     Past Surgical History:  Procedure Laterality Date  . INDUCED ABORTION  2002  . WISDOM TOOTH EXTRACTION  2016    Current Outpatient Prescriptions on File Prior to Visit  Medication Sig Dispense Refill  . Cholecalciferol (VITAMIN D3) 50000 units TABS Take 1 tablet by mouth once a week. (Patient not taking: Reported on 04/25/2016) 12 tablet 1   No current facility-administered medications on file prior to visit.     No Known Allergies  Social History   Social History  . Marital status: Single    Spouse name: N/A  . Number of children: N/A  . Years of education: N/A   Occupational  History  . Not on file.   Social History Main Topics  . Smoking status: Never Smoker  . Smokeless tobacco: Never Used  . Alcohol use 0.0 oz/week     Comment: occ  . Drug use: No  . Sexual activity: Not Currently   Other Topics Concern  . 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 CAD Father [id number]    MI  . Breast cancer Maternal Grandmother 1570  . Rheum arthritis Mother   . Stroke Neg Hx   . Diabetes Neg Hx     The following portions of the patient's history were reviewed and updated as appropriate: allergies, current medications, past family history, past medical history, past social history, past surgical history and problem list.  Review of Systems Pertinent items noted in HPI and remainder of comprehensive ROS otherwise negative.   Objective:  BP 114/79 (BP Location: Left Arm, Patient Position: Sitting, Cuff Size: Large)   Pulse 80   Ht 5\' 5"  (1.651 m)   Wt 275 lb (124.7 kg)   BMI  45.76 kg/m  CONSTITUTIONAL: Well-developed, well-nourished female in no acute distress.  HENT:  Normocephalic, atraumatic, External right and left ear normal. Oropharynx is clear and moist EYES: Conjunctivae and EOM are normal. Pupils are equal, round, and reactive to light. No scleral icterus.  NECK: Normal range of motion, supple, no masses.  Normal thyroid.  SKIN: Skin is warm and dry. No rash noted. Not diaphoretic. No erythema. No pallor. NEUROLOGIC: Alert and oriented to person, place, and time. Normal reflexes, muscle tone coordination. No cranial nerve deficit noted. PSYCHIATRIC: Normal mood and affect. Normal behavior. Normal judgment and thought content. CARDIOVASCULAR: Normal heart rate noted, regular rhythm RESPIRATORY: Clear to  auscultation bilaterally. Effort and breath sounds normal, no problems with respiration noted. BREASTS: Large, symmetric in size. No masses, skin changes, nipple drainage, or lymphadenopathy. ABDOMEN: Soft, obese, normal bowel sounds, no distention appreciated.  No tenderness, rebound or guarding.  PELVIC: Normal appearing external genitalia; normal appearing vaginal mucosa and cervix.  Normal appearing discharge.  Pap smear obtained.  Unable to palpate uterus or adnexa secondary to habitus.  MUSCULOSKELETAL: Normal range of motion. No tenderness.  No cyanosis, clubbing, or edema.  2+ distal pulses.   Assessment:  Annual gynecologic examination with pap smear Amenorrhea and associated infertility   Plan:  Will follow up results of pap smear and manage accordingly. Patient will follow up with REI for further discussion about infertility Routine preventative health maintenance measures emphasized. Please refer to After Visit Summary for other counseling recommendations.    Jaynie Collins, MD, FACOG Attending Obstetrician & Gynecologist, [facility] and [facility]

## 2016-04-25 NOTE — Patient Instructions (Signed)
Preventive Care for Adults, Female A healthy lifestyle and preventive care can promote health and wellness. Preventive health guidelines for women include the following key practices.  A routine yearly physical is a good way to check with your health care provider about your health and preventive screening. It is a chance to share any concerns and updates on your health and to receive a thorough exam.  Visit your dentist for a routine exam and preventive care every 6 months. Brush your teeth twice a day and floss once a day. Good oral hygiene prevents tooth decay and gum disease.  The frequency of eye exams is based on your age, health, family medical history, use of contact lenses, and other factors. Follow your health care provider's recommendations for frequency of eye exams.  Eat a healthy diet. Foods like vegetables, fruits, whole grains, low-fat dairy products, and lean protein foods contain the nutrients you need without too many calories. Decrease your intake of foods high in solid fats, added sugars, and salt. Eat the right amount of calories for you.Get information about a proper diet from your health care provider, if necessary.  Regular physical exercise is one of the most important things you can do for your health. Most adults should get at least 150 minutes of moderate-intensity exercise (any activity that increases your heart rate and causes you to sweat) each week. In addition, most adults need muscle-strengthening exercises on 2 or more days a week.  Maintain a healthy weight. The body mass index (BMI) is a screening tool to identify possible weight problems. It provides an estimate of body fat based on height and weight. Your health care provider can find your BMI and can help you achieve or maintain a healthy weight.For adults 20 years and older:  A BMI below 18.5 is considered underweight.  A BMI of 18.5 to 24.9 is normal.  A BMI of 25 to 29.9 is considered overweight.  A  BMI of 30 and above is considered obese.  Maintain normal blood lipids and cholesterol levels by exercising and minimizing your intake of saturated fat. Eat a balanced diet with plenty of fruit and vegetables. Blood tests for lipids and cholesterol should begin at age 45 and be repeated every 5 years. If your lipid or cholesterol levels are high, you are over 50, or you are at high risk for heart disease, you may need your cholesterol levels checked more frequently.Ongoing high lipid and cholesterol levels should be treated with medicines if diet and exercise are not working.  If you smoke, find out from your health care provider how to quit. If you do not use tobacco, do not start.  Lung cancer screening is recommended for adults aged 45-80 years who are at high risk for developing lung cancer because of a history of smoking. A yearly low-dose CT scan of the lungs is recommended for people who have at least a 30-pack-year history of smoking and are a current smoker or have quit within the past 15 years. A pack year of smoking is smoking an average of 1 pack of cigarettes a day for 1 year (for example: 1 pack a day for 30 years or 2 packs a day for 15 years). Yearly screening should continue until the smoker has stopped smoking for at least 15 years. Yearly screening should be stopped for people who develop a health problem that would prevent them from having lung cancer treatment.  If you are pregnant, do not drink alcohol. If you are  breastfeeding, be very cautious about drinking alcohol. If you are not pregnant and choose to drink alcohol, do not have more than 1 drink per day. One drink is considered to be 12 ounces (355 mL) of beer, 5 ounces (148 mL) of wine, or 1.5 ounces (44 mL) of liquor.  Avoid use of street drugs. Do not share needles with anyone. Ask for help if you need support or instructions about stopping the use of drugs.  High blood pressure causes heart disease and increases the risk  of stroke. Your blood pressure should be checked at least every 1 to 2 years. Ongoing high blood pressure should be treated with medicines if weight loss and exercise do not work.  If you are 55-79 years old, ask your health care provider if you should take aspirin to prevent strokes.  Diabetes screening is done by taking a blood sample to check your blood glucose level after you have not eaten for a certain period of time (fasting). If you are not overweight and you do not have risk factors for diabetes, you should be screened once every 3 years starting at age 45. If you are overweight or obese and you are 40-70 years of age, you should be screened for diabetes every year as part of your cardiovascular risk assessment.  Breast cancer screening is essential preventive care for women. You should practice "breast self-awareness." This means understanding the normal appearance and feel of your breasts and may include breast self-examination. Any changes detected, no matter how small, should be reported to a health care provider. Women in their 20s and 30s should have a clinical breast exam (CBE) by a health care provider as part of a regular health exam every 1 to 3 years. After age 40, women should have a CBE every year. Starting at age 40, women should consider having a mammogram (breast X-ray test) every year. Women who have a family history of breast cancer should talk to their health care provider about genetic screening. Women at a high risk of breast cancer should talk to their health care providers about having an MRI and a mammogram every year.  Breast cancer gene (BRCA)-related cancer risk assessment is recommended for women who have family members with BRCA-related cancers. BRCA-related cancers include breast, ovarian, tubal, and peritoneal cancers. Having family members with these cancers may be associated with an increased risk for harmful changes (mutations) in the breast cancer genes BRCA1 and  BRCA2. Results of the assessment will determine the need for genetic counseling and BRCA1 and BRCA2 testing.  Your health care provider may recommend that you be screened regularly for cancer of the pelvic organs (ovaries, uterus, and vagina). This screening involves a pelvic examination, including checking for microscopic changes to the surface of your cervix (Pap test). You may be encouraged to have this screening done every 3 years, beginning at age 21.  For women ages 30-65, health care providers may recommend pelvic exams and Pap testing every 3 years, or they may recommend the Pap and pelvic exam, combined with testing for human papilloma virus (HPV), every 5 years. Some types of HPV increase your risk of cervical cancer. Testing for HPV may also be done on women of any age with unclear Pap test results.  Other health care providers may not recommend any screening for nonpregnant women who are considered low risk for pelvic cancer and who do not have symptoms. Ask your health care provider if a screening pelvic exam is right for   you.  If you have had past treatment for cervical cancer or a condition that could lead to cancer, you need Pap tests and screening for cancer for at least 20 years after your treatment. If Pap tests have been discontinued, your risk factors (such as having a new sexual partner) need to be reassessed to determine if screening should resume. Some women have medical problems that increase the chance of getting cervical cancer. In these cases, your health care provider may recommend more frequent screening and Pap tests.  Colorectal cancer can be detected and often prevented. Most routine colorectal cancer screening begins at the age of 50 years and continues through age 75 years. However, your health care provider may recommend screening at an earlier age if you have risk factors for colon cancer. On a yearly basis, your health care provider may provide home test kits to check  for hidden blood in the stool. Use of a small camera at the end of a tube, to directly examine the colon (sigmoidoscopy or colonoscopy), can detect the earliest forms of colorectal cancer. Talk to your health care provider about this at age 50, when routine screening begins. Direct exam of the colon should be repeated every 5-10 years through age 75 years, unless early forms of precancerous polyps or small growths are found.  People who are at an increased risk for hepatitis B should be screened for this virus. You are considered at high risk for hepatitis B if:  You were born in a country where hepatitis B occurs often. Talk with your health care provider about which countries are considered high risk.  Your parents were born in a high-risk country and you have not received a shot to protect against hepatitis B (hepatitis B vaccine).  You have HIV or AIDS.  You use needles to inject street drugs.  You live with, or have sex with, someone who has hepatitis B.  You get hemodialysis treatment.  You take certain medicines for conditions like cancer, organ transplantation, and autoimmune conditions.  Hepatitis C blood testing is recommended for all people born from 1945 through 1965 and any individual with known risks for hepatitis C.  Practice safe sex. Use condoms and avoid high-risk sexual practices to reduce the spread of sexually transmitted infections (STIs). STIs include gonorrhea, chlamydia, syphilis, trichomonas, herpes, HPV, and human immunodeficiency virus (HIV). Herpes, HIV, and HPV are viral illnesses that have no cure. They can result in disability, cancer, and death.  You should be screened for sexually transmitted illnesses (STIs) including gonorrhea and chlamydia if:  You are sexually active and are younger than 24 years.  You are older than 24 years and your health care provider tells you that you are at risk for this type of infection.  Your sexual activity has changed  since you were last screened and you are at an increased risk for chlamydia or gonorrhea. Ask your health care provider if you are at risk.  If you are at risk of being infected with HIV, it is recommended that you take a prescription medicine daily to prevent HIV infection. This is called preexposure prophylaxis (PrEP). You are considered at risk if:  You are sexually active and do not regularly use condoms or know the HIV status of your partner(s).  You take drugs by injection.  You are sexually active with a partner who has HIV.  Talk with your health care provider about whether you are at high risk of being infected with HIV. If   you choose to begin PrEP, you should first be tested for HIV. You should then be tested every 3 months for as long as you are taking PrEP.  Osteoporosis is a disease in which the bones lose minerals and strength with aging. This can result in serious bone fractures or breaks. The risk of osteoporosis can be identified using a bone density scan. Women ages 67 years and over and women at risk for fractures or osteoporosis should discuss screening with their health care providers. Ask your health care provider whether you should take a calcium supplement or vitamin D to reduce the rate of osteoporosis.  Menopause can be associated with physical symptoms and risks. Hormone replacement therapy is available to decrease symptoms and risks. You should talk to your health care provider about whether hormone replacement therapy is right for you.  Use sunscreen. Apply sunscreen liberally and repeatedly throughout the day. You should seek shade when your shadow is shorter than you. Protect yourself by wearing long sleeves, pants, a wide-brimmed hat, and sunglasses year round, whenever you are outdoors.  Once a month, do a whole body skin exam, using a mirror to look at the skin on your back. Tell your health care provider of new moles, moles that have irregular borders, moles that  are larger than a pencil eraser, or moles that have changed in shape or color.  Stay current with required vaccines (immunizations).  Influenza vaccine. All adults should be immunized every year.  Tetanus, diphtheria, and acellular pertussis (Td, Tdap) vaccine. Pregnant women should receive 1 dose of Tdap vaccine during each pregnancy. The dose should be obtained regardless of the length of time since the last dose. Immunization is preferred during the 27th-36th week of gestation. An adult who has not previously received Tdap or who does not know her vaccine status should receive 1 dose of Tdap. This initial dose should be followed by tetanus and diphtheria toxoids (Td) booster doses every 10 years. Adults with an unknown or incomplete history of completing a 3-dose immunization series with Td-containing vaccines should begin or complete a primary immunization series including a Tdap dose. Adults should receive a Td booster every 10 years.  Varicella vaccine. An adult without evidence of immunity to varicella should receive 2 doses or a second dose if she has previously received 1 dose. Pregnant females who do not have evidence of immunity should receive the first dose after pregnancy. This first dose should be obtained before leaving the health care facility. The second dose should be obtained 4-8 weeks after the first dose.  Human papillomavirus (HPV) vaccine. Females aged 13-26 years who have not received the vaccine previously should obtain the 3-dose series. The vaccine is not recommended for use in pregnant females. However, pregnancy testing is not needed before receiving a dose. If a female is found to be pregnant after receiving a dose, no treatment is needed. In that case, the remaining doses should be delayed until after the pregnancy. Immunization is recommended for any person with an immunocompromised condition through the age of 61 years if she did not get any or all doses earlier. During the  3-dose series, the second dose should be obtained 4-8 weeks after the first dose. The third dose should be obtained 24 weeks after the first dose and 16 weeks after the second dose.  Zoster vaccine. One dose is recommended for adults aged 30 years or older unless certain conditions are present.  Measles, mumps, and rubella (MMR) vaccine. Adults born  before 1957 generally are considered immune to measles and mumps. Adults born in 1957 or later should have 1 or more doses of MMR vaccine unless there is a contraindication to the vaccine or there is laboratory evidence of immunity to each of the three diseases. A routine second dose of MMR vaccine should be obtained at least 28 days after the first dose for students attending postsecondary schools, health care workers, or international travelers. People who received inactivated measles vaccine or an unknown type of measles vaccine during 1963-1967 should receive 2 doses of MMR vaccine. People who received inactivated mumps vaccine or an unknown type of mumps vaccine before 1979 and are at high risk for mumps infection should consider immunization with 2 doses of MMR vaccine. For females of childbearing age, rubella immunity should be determined. If there is no evidence of immunity, females who are not pregnant should be vaccinated. If there is no evidence of immunity, females who are pregnant should delay immunization until after pregnancy. Unvaccinated health care workers born before 1957 who lack laboratory evidence of measles, mumps, or rubella immunity or laboratory confirmation of disease should consider measles and mumps immunization with 2 doses of MMR vaccine or rubella immunization with 1 dose of MMR vaccine.  Pneumococcal 13-valent conjugate (PCV13) vaccine. When indicated, a person who is uncertain of his immunization history and has no record of immunization should receive the PCV13 vaccine. All adults 65 years of age and older should receive this  vaccine. An adult aged 19 years or older who has certain medical conditions and has not been previously immunized should receive 1 dose of PCV13 vaccine. This PCV13 should be followed with a dose of pneumococcal polysaccharide (PPSV23) vaccine. Adults who are at high risk for pneumococcal disease should obtain the PPSV23 vaccine at least 8 weeks after the dose of PCV13 vaccine. Adults older than 36 years of age who have normal immune system function should obtain the PPSV23 vaccine dose at least 1 year after the dose of PCV13 vaccine.  Pneumococcal polysaccharide (PPSV23) vaccine. When PCV13 is also indicated, PCV13 should be obtained first. All adults aged 65 years and older should be immunized. An adult younger than age 65 years who has certain medical conditions should be immunized. Any person who resides in a nursing home or long-term care facility should be immunized. An adult smoker should be immunized. People with an immunocompromised condition and certain other conditions should receive both PCV13 and PPSV23 vaccines. People with human immunodeficiency virus (HIV) infection should be immunized as soon as possible after diagnosis. Immunization during chemotherapy or radiation therapy should be avoided. Routine use of PPSV23 vaccine is not recommended for American Indians, [place] Natives, or people younger than 65 years unless there are medical conditions that require PPSV23 vaccine. When indicated, people who have unknown immunization and have no record of immunization should receive PPSV23 vaccine. One-time revaccination 5 years after the first dose of PPSV23 is recommended for people aged 19-64 years who have chronic kidney failure, nephrotic syndrome, asplenia, or immunocompromised conditions. People who received 1-2 doses of PPSV23 before age 65 years should receive another dose of PPSV23 vaccine at age 65 years or later if at least 5 years have passed since the previous dose. Doses of PPSV23 are not  needed for people immunized with PPSV23 at or after age 65 years.  Meningococcal vaccine. Adults with asplenia or persistent complement component deficiencies should receive 2 doses of quadrivalent meningococcal conjugate (MenACWY-D) vaccine. The doses should be obtained   at least 2 months apart. Microbiologists working with certain meningococcal bacteria, [place] recruits, people at risk during an outbreak, and people who travel to or live in countries with a high rate of meningitis should be immunized. A first-year college student up through age 34 years who is living in a residence hall should receive a dose if she did not receive a dose on or after her 16th birthday. Adults who have certain high-risk conditions should receive one or more doses of vaccine.  Hepatitis A vaccine. Adults who wish to be protected from this disease, have certain high-risk conditions, work with hepatitis A-infected animals, work in hepatitis A research labs, or travel to or work in countries with a high rate of hepatitis A should be immunized. Adults who were previously unvaccinated and who anticipate close contact with an international adoptee during the first 60 days after arrival in the [place] from a country with a high rate of hepatitis A should be immunized.  Hepatitis B vaccine. Adults who wish to be protected from this disease, have certain high-risk conditions, may be exposed to blood or other infectious body fluids, are household contacts or sex partners of hepatitis B positive people, are clients or workers in certain care facilities, or travel to or work in countries with a high rate of hepatitis B should be immunized.  Haemophilus influenzae type b (Hib) vaccine. A previously unvaccinated person with asplenia or sickle cell disease or having a scheduled splenectomy should receive 1 dose of Hib vaccine. Regardless of previous immunization, a recipient of a hematopoietic stem cell transplant should receive a  3-dose series 6-12 months after her successful transplant. Hib vaccine is not recommended for adults with HIV infection. Preventive Services / Frequency Ages 35 to 4 years  Blood pressure check.** / Every 3-5 years.  Lipid and cholesterol check.** / Every 5 years beginning at age 60.  Clinical breast exam.** / Every 3 years for women in their 71s and 10s.  BRCA-related cancer risk assessment.** / For women who have family members with a BRCA-related cancer (breast, ovarian, tubal, or peritoneal cancers).  Pap test.** / Every 2 years from ages 76 through 26. Every 3 years starting at age 61 through age 76 or 93 with a history of 3 consecutive normal Pap tests.  HPV screening.** / Every 3 years from ages 37 through ages 60 to 51 with a history of 3 consecutive normal Pap tests.  Hepatitis C blood test.** / For any individual with known risks for hepatitis C.  Skin self-exam. / Monthly.  Influenza vaccine. / Every year.  Tetanus, diphtheria, and acellular pertussis (Tdap, Td) vaccine.** / Consult your health care provider. Pregnant women should receive 1 dose of Tdap vaccine during each pregnancy. 1 dose of Td every 10 years.  Varicella vaccine.** / Consult your health care provider. Pregnant females who do not have evidence of immunity should receive the first dose after pregnancy.  HPV vaccine. / 3 doses over 6 months, if 93 and younger. The vaccine is not recommended for use in pregnant females. However, pregnancy testing is not needed before receiving a dose.  Measles, mumps, rubella (MMR) vaccine.** / You need at least 1 dose of MMR if you were born in 1957 or later. You may also need a 2nd dose. For females of childbearing age, rubella immunity should be determined. If there is no evidence of immunity, females who are not pregnant should be vaccinated. If there is no evidence of immunity, females who are  pregnant should delay immunization until after pregnancy.  Pneumococcal  13-valent conjugate (PCV13) vaccine.** / Consult your health care provider.  Pneumococcal polysaccharide (PPSV23) vaccine.** / 1 to 2 doses if you smoke cigarettes or if you have certain conditions.  Meningococcal vaccine.** / 1 dose if you are age 68 to 8 years and a Market researcher living in a residence hall, or have one of several medical conditions, you need to get vaccinated against meningococcal disease. You may also need additional booster doses.  Hepatitis A vaccine.** / Consult your health care provider.  Hepatitis B vaccine.** / Consult your health care provider.  Haemophilus influenzae type b (Hib) vaccine.** / Consult your health care provider. Ages 7 to 53 years  Blood pressure check.** / Every year.  Lipid and cholesterol check.** / Every 5 years beginning at age 25 years.  Lung cancer screening. / Every year if you are aged 11-80 years and have a 30-pack-year history of smoking and currently smoke or have quit within the past 15 years. Yearly screening is stopped once you have quit smoking for at least 15 years or develop a health problem that would prevent you from having lung cancer treatment.  Clinical breast exam.** / Every year after age 48 years.  BRCA-related cancer risk assessment.** / For women who have family members with a BRCA-related cancer (breast, ovarian, tubal, or peritoneal cancers).  Mammogram.** / Every year beginning at age 41 years and continuing for as long as you are in good health. Consult with your health care provider.  Pap test.** / Every 3 years starting at age 65 years through age 37 or 70 years with a history of 3 consecutive normal Pap tests.  HPV screening.** / Every 3 years from ages 72 years through ages 60 to 40 years with a history of 3 consecutive normal Pap tests.  Fecal occult blood test (FOBT) of stool. / Every year beginning at age 21 years and continuing until age 5 years. You may not need to do this test if you get  a colonoscopy every 10 years.  Flexible sigmoidoscopy or colonoscopy.** / Every 5 years for a flexible sigmoidoscopy or every 10 years for a colonoscopy beginning at age 35 years and continuing until age 48 years.  Hepatitis C blood test.** / For all people born from [birth year] through 1965 and any individual with known risks for hepatitis C.  Skin self-exam. / Monthly.  Influenza vaccine. / Every year.  Tetanus, diphtheria, and acellular pertussis (Tdap/Td) vaccine.** / Consult your health care provider. Pregnant women should receive 1 dose of Tdap vaccine during each pregnancy. 1 dose of Td every 10 years.  Varicella vaccine.** / Consult your health care provider. Pregnant females who do not have evidence of immunity should receive the first dose after pregnancy.  Zoster vaccine.** / 1 dose for adults aged 30 years or older.  Measles, mumps, rubella (MMR) vaccine.** / You need at least 1 dose of MMR if you were born in 1957 or later. You may also need a second dose. For females of childbearing age, rubella immunity should be determined. If there is no evidence of immunity, females who are not pregnant should be vaccinated. If there is no evidence of immunity, females who are pregnant should delay immunization until after pregnancy.  Pneumococcal 13-valent conjugate (PCV13) vaccine.** / Consult your health care provider.  Pneumococcal polysaccharide (PPSV23) vaccine.** / 1 to 2 doses if you smoke cigarettes or if you have certain conditions.  Meningococcal vaccine.** /  Consult your health care provider.  Hepatitis A vaccine.** / Consult your health care provider.  Hepatitis B vaccine.** / Consult your health care provider.  Haemophilus influenzae type b (Hib) vaccine.** / Consult your health care provider. Ages 64 years and over  Blood pressure check.** / Every year.  Lipid and cholesterol check.** / Every 5 years beginning at age 23 years.  Lung cancer screening. / Every year if you  are aged 16-80 years and have a 30-pack-year history of smoking and currently smoke or have quit within the past 15 years. Yearly screening is stopped once you have quit smoking for at least 15 years or develop a health problem that would prevent you from having lung cancer treatment.  Clinical breast exam.** / Every year after age 74 years.  BRCA-related cancer risk assessment.** / For women who have family members with a BRCA-related cancer (breast, ovarian, tubal, or peritoneal cancers).  Mammogram.** / Every year beginning at age 44 years and continuing for as long as you are in good health. Consult with your health care provider.  Pap test.** / Every 3 years starting at age 58 years through age 22 or 39 years with 3 consecutive normal Pap tests. Testing can be stopped between 65 and 70 years with 3 consecutive normal Pap tests and no abnormal Pap or HPV tests in the past 10 years.  HPV screening.** / Every 3 years from ages 64 years through ages 70 or 61 years with a history of 3 consecutive normal Pap tests. Testing can be stopped between 65 and 70 years with 3 consecutive normal Pap tests and no abnormal Pap or HPV tests in the past 10 years.  Fecal occult blood test (FOBT) of stool. / Every year beginning at age 40 years and continuing until age 27 years. You may not need to do this test if you get a colonoscopy every 10 years.  Flexible sigmoidoscopy or colonoscopy.** / Every 5 years for a flexible sigmoidoscopy or every 10 years for a colonoscopy beginning at age 7 years and continuing until age 32 years.  Hepatitis C blood test.** / For all people born from [birth year] through 1966 and any individual with known risks for hepatitis C.  Osteoporosis screening.** / A one-time screening for women ages 30 years and over and women at risk for fractures or osteoporosis.  Skin self-exam. / Monthly.  Influenza vaccine. / Every year.  Tetanus, diphtheria, and acellular pertussis (Tdap/Td)  vaccine.** / 1 dose of Td every 10 years.  Varicella vaccine.** / Consult your health care provider.  Zoster vaccine.** / 1 dose for adults aged 35 years or older.  Pneumococcal 13-valent conjugate (PCV13) vaccine.** / Consult your health care provider.  Pneumococcal polysaccharide (PPSV23) vaccine.** / 1 dose for all adults aged 46 years and older.  Meningococcal vaccine.** / Consult your health care provider.  Hepatitis A vaccine.** / Consult your health care provider.  Hepatitis B vaccine.** / Consult your health care provider.  Haemophilus influenzae type b (Hib) vaccine.** / Consult your health care provider. ** Family history and personal history of risk and conditions may change your health care provider's recommendations.   This information is not intended to replace advice given to you by your health care provider. Make sure you discuss any questions you have with your health care provider.   Document Released: 08/16/2001 Document Revised: 07/11/2014 Document Reviewed: 11/15/2010 Elsevier Interactive Patient Education [employer].

## 2016-04-26 ENCOUNTER — Encounter: Payer: Self-pay | Admitting: *Deleted

## 2016-04-27 LAB — CYTOLOGY - PAP
DIAGNOSIS: NEGATIVE
HPV (WINDOPATH): NOT DETECTED

## 2016-05-06 ENCOUNTER — Encounter: Payer: Self-pay | Admitting: Pulmonary Disease

## 2016-05-06 ENCOUNTER — Ambulatory Visit (INDEPENDENT_AMBULATORY_CARE_PROVIDER_SITE_OTHER): Payer: 59 | Admitting: Pulmonary Disease

## 2016-05-06 VITALS — BP 128/78 | HR 77 | Ht 65.0 in | Wt 276.0 lb

## 2016-05-06 DIAGNOSIS — E669 Obesity, unspecified: Secondary | ICD-10-CM | POA: Diagnosis not present

## 2016-05-06 DIAGNOSIS — G4733 Obstructive sleep apnea (adult) (pediatric): Secondary | ICD-10-CM | POA: Diagnosis not present

## 2016-05-06 NOTE — Patient Instructions (Signed)
Continue CPAP We discussed weight loss Follow up in 4-6 months

## 2016-05-08 NOTE — Progress Notes (Signed)
PULMONARY OFFICE FOLLOW UP NOTE  Requesting MD/Service: Guitierrez Date of initial consultation: 01/29/16 Reason for consultation: snoring, hypersomnolence  PT PROFILE: 1937 F with obesity, snoring, daytime hypersomnolence.   DATA: 02/15/16 Home sleep study: AHI 14/hr, lowest SpO2 79%. Autoset CPAP initiated 10/02-10/31/17 CPAP compliance: Usage 24/30 days, > 4 hours 22 days.   SUBJ:  Complaint with CPAP. Using nasal pillows and chin strap. Feels significantly better. No new complaints  OBJ:  Vitals:   05/06/16 0858  BP: 128/78  Pulse: 77  SpO2: 100%  Weight: 276 lb (125.2 kg)  Height: 5\' 5"  (1.651 m)    EXAM:  Gen: Obese, NAD HEENT: NCAT, sclera white, oropharynx normal Neck: Supple without LAN, thyromegaly, JVD Lungs: breath sounds: full, percussion: normal, No adventitious sounds Cardiovascular: RRR, no murmurs noted Abdomen: Soft, nontender, normal BS Ext: without clubbing, cyanosis, edema Neuro: grossly intact Skin: Limited exam, no lesions noted   CXR:  NNF  IMPRESSION:   Mild OSA (AHI 14) with excessive daytime somnolence  Compliant with and benefiting from CPAP Obesity   PLAN:  - Continue CPAP using Autoset 5-15 - We discussed benefits of weight loss including benefit to her overall health and the possibility of reversal of OSA. We discussed strategies and I emphasized strict limitation of refined sugars and simple carbohydrates - ROV 4-6 months  Billy Fischeravid Analeya Luallen, MD PCCM service Mobile [phone] Pager [phone] 05/08/2016

## 2016-07-20 DIAGNOSIS — G4733 Obstructive sleep apnea (adult) (pediatric): Secondary | ICD-10-CM | POA: Diagnosis not present

## 2016-08-20 DIAGNOSIS — G4733 Obstructive sleep apnea (adult) (pediatric): Secondary | ICD-10-CM | POA: Diagnosis not present

## 2016-09-17 DIAGNOSIS — G4733 Obstructive sleep apnea (adult) (pediatric): Secondary | ICD-10-CM | POA: Diagnosis not present

## 2016-10-18 DIAGNOSIS — G4733 Obstructive sleep apnea (adult) (pediatric): Secondary | ICD-10-CM | POA: Diagnosis not present

## 2016-11-02 DIAGNOSIS — G4733 Obstructive sleep apnea (adult) (pediatric): Secondary | ICD-10-CM | POA: Diagnosis not present

## 2016-11-17 DIAGNOSIS — G4733 Obstructive sleep apnea (adult) (pediatric): Secondary | ICD-10-CM | POA: Diagnosis not present

## 2016-12-18 DIAGNOSIS — G4733 Obstructive sleep apnea (adult) (pediatric): Secondary | ICD-10-CM | POA: Diagnosis not present

## 2016-12-27 ENCOUNTER — Ambulatory Visit (INDEPENDENT_AMBULATORY_CARE_PROVIDER_SITE_OTHER): Payer: 59 | Admitting: Pulmonary Disease

## 2016-12-27 ENCOUNTER — Encounter: Payer: Self-pay | Admitting: Pulmonary Disease

## 2016-12-27 VITALS — BP 110/60 | HR 96 | Resp 16 | Ht 65.0 in | Wt 274.0 lb

## 2016-12-27 DIAGNOSIS — G4733 Obstructive sleep apnea (adult) (pediatric): Secondary | ICD-10-CM | POA: Diagnosis not present

## 2016-12-27 DIAGNOSIS — Z6841 Body Mass Index (BMI) 40.0 and over, adult: Secondary | ICD-10-CM | POA: Diagnosis not present

## 2016-12-27 DIAGNOSIS — E66813 Obesity, class 3: Secondary | ICD-10-CM

## 2016-12-27 NOTE — Progress Notes (Signed)
PULMONARY OFFICE FOLLOW UP NOTE  Requesting MD/Service: Guitierrez Date of initial consultation: 01/29/16 Reason for consultation: snoring, hypersomnolence  PT PROFILE: [age] F with obesity, snoring, daytime hypersomnolence.   DATA: 02/15/16 Home sleep study: AHI 14/hr, lowest SpO2 79%. Autoset CPAP initiated 10/02-10/31/17 CPAP compliance: Usage 24/30 days, > 4 hours 22 days.   SUBJ:  Continues to feel that she is benefiting from CPAP which she wears most nights. In her words it has "really helped". She has no new complaints.  OBJ:  Vitals:   12/27/16 0942  BP: 110/60  Pulse: 96  Resp: 16  SpO2: 99%  Weight: 274 lb (124.3 kg)  Height: 5\' 5"  (1.651 m)    EXAM:  Gen: Obese, NAD HEENT: NCAT, sclera white, oropharynx normal Neck: Supple without JVD Lungs: No adventitious sounds Cardiovascular: Regular, no murmurs Abdomen: Obese, soft, nontender Ext: without edema Neuro: grossly intact   CXR:  NNF  IMPRESSION:   Mild OSA (AHI 14) with excessive daytime somnolence - much improved with CPAP Obesity   PLAN:  - Continue CPAP using Autoset 5-15 - We again discussed benefits of and strategies for weight loss  - ROV 6 months  Billy Fischeravid Toccara Alford, MD PCCM service Mobile [phone] Pager [phone] 12/27/2016

## 2016-12-27 NOTE — Patient Instructions (Signed)
Continue CPAP as prescribed Follow-up in 6 months

## 2017-05-17 ENCOUNTER — Encounter: Payer: Self-pay | Admitting: Family Medicine

## 2017-05-24 DIAGNOSIS — G4733 Obstructive sleep apnea (adult) (pediatric): Secondary | ICD-10-CM | POA: Diagnosis not present

## 2017-06-06 ENCOUNTER — Encounter: Payer: Self-pay | Admitting: Family Medicine

## 2017-06-06 ENCOUNTER — Ambulatory Visit (INDEPENDENT_AMBULATORY_CARE_PROVIDER_SITE_OTHER): Payer: 59 | Admitting: Family Medicine

## 2017-06-06 VITALS — BP 122/80 | HR 70 | Temp 98.5°F | Ht 64.25 in | Wt 275.0 lb

## 2017-06-06 DIAGNOSIS — N183 Chronic kidney disease, stage 3 (moderate): Secondary | ICD-10-CM

## 2017-06-06 DIAGNOSIS — Z0001 Encounter for general adult medical examination with abnormal findings: Secondary | ICD-10-CM

## 2017-06-06 DIAGNOSIS — D631 Anemia in chronic kidney disease: Secondary | ICD-10-CM | POA: Diagnosis not present

## 2017-06-06 DIAGNOSIS — R21 Rash and other nonspecific skin eruption: Secondary | ICD-10-CM | POA: Diagnosis not present

## 2017-06-06 DIAGNOSIS — Z0000 Encounter for general adult medical examination without abnormal findings: Secondary | ICD-10-CM

## 2017-06-06 DIAGNOSIS — G4733 Obstructive sleep apnea (adult) (pediatric): Secondary | ICD-10-CM | POA: Diagnosis not present

## 2017-06-06 DIAGNOSIS — Z9989 Dependence on other enabling machines and devices: Secondary | ICD-10-CM

## 2017-06-06 DIAGNOSIS — E559 Vitamin D deficiency, unspecified: Secondary | ICD-10-CM | POA: Diagnosis not present

## 2017-06-06 DIAGNOSIS — N289 Disorder of kidney and ureter, unspecified: Secondary | ICD-10-CM | POA: Diagnosis not present

## 2017-06-06 MED ORDER — TRIAMCINOLONE ACETONIDE 0.1 % EX CREA: 1.0000 "application " | TOPICAL_CREAM | Freq: Two times a day (BID) | CUTANEOUS | 1 refills | Status: AC

## 2017-06-06 NOTE — Progress Notes (Signed)
BP 122/80 (BP Location: Left Arm, Patient Position: Sitting, Cuff Size: Large)   Pulse 70   Temp 98.5 F (36.9 C) (Oral)   Ht 5' 4.25" (1.632 m)   Wt 275 lb (124.7 kg)   SpO2 97%   BMI 46.84 kg/m    CC: CPE Subjective:    Patient ID: Courtney Jones, female    DOB: 02/04/1981, 36 y.o.   MRN: [id number]  HPI: Courtney Jones is a 36 y.o. female presenting on 06/06/2017 for Annual Exam   OSA on CPAP followed by Dr Sung AmabileSimonds.  Breakouts on skin - for several months. Mild itching, no tender. Treating with hydrocortisone ointment. No new lotions, detergents, soaps or shampoos, new foods, new medicines. No h/o asthma or eczema.   Interested in keto diet.   Preventative: Well woman Dr Macon LargeAnyanwu - normal paps, h/o HPV. Last 04/2016.  Flu shot yearly Tdap 2011 Seat belt use discussed Sunscreen use discussed. No changing moles on skin.  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 Interim medical history since our last visit reviewed. Allergies and medications reviewed and updated. Outpatient Medications Prior to Visit  Medication Sig Dispense Refill  . Cholecalciferol (VITAMIN D3) 50000 units TABS Take 1 tablet by mouth once a week. 12 tablet 1   No facility-administered medications prior to visit.      Per HPI unless specifically indicated in ROS section below Review of Systems  Constitutional: Negative for activity change, appetite change, chills, fatigue, fever and unexpected weight change.  HENT: Negative for hearing loss.   Eyes: Negative for visual disturbance.  Respiratory: Negative for cough, chest tightness, shortness of breath and wheezing.   Cardiovascular: Negative for chest pain, palpitations and leg swelling.    Gastrointestinal: Negative for abdominal distention, abdominal pain, blood in stool, constipation, diarrhea, nausea and vomiting.  Genitourinary: Negative for difficulty urinating and hematuria.  Musculoskeletal: Negative for arthralgias, myalgias and neck pain.  Skin: Negative for rash.  Neurological: Negative for dizziness, seizures, syncope and headaches.  Hematological: Negative for adenopathy. Does not bruise/bleed easily.  Psychiatric/Behavioral: Negative for dysphoric mood. The patient is not nervous/anxious.        Objective:    BP 122/80 (BP Location: Left Arm, Patient Position: Sitting, Cuff Size: Large)   Pulse 70   Temp 98.5 F (36.9 C) (Oral)   Ht 5' 4.25" (1.632 m)   Wt 275 lb (124.7 kg)   SpO2 97%   BMI 46.84 kg/m   Wt Readings from Last 3 Encounters:  06/06/17 275 lb (124.7 kg)  12/27/16 274 lb (124.3 kg)  05/06/16 276 lb (125.2 kg)    Physical Exam  Constitutional: She is oriented to person, place, and time. She appears well-developed and well-nourished. No distress.  HENT:  Head: Normocephalic and atraumatic.  Right Ear: Hearing, tympanic membrane, external ear and ear canal normal.  Left Ear: Hearing, tympanic membrane, external ear and ear canal normal.  Nose: Nose normal.  Mouth/Throat: Uvula is midline, oropharynx is clear and moist and mucous membranes are normal. No oropharyngeal exudate, posterior oropharyngeal edema or posterior oropharyngeal erythema.  Eyes: Conjunctivae and EOM are normal. Pupils are equal, round, and reactive to light. No scleral icterus.  Neck: Normal range of motion. Neck supple. No thyromegaly  present.  Cardiovascular: Normal rate, regular rhythm, normal heart sounds and intact distal pulses.  No murmur heard. Pulses:      Radial pulses are 2+ on the right side, and 2+ on the left side.  Pulmonary/Chest: Effort normal and breath sounds normal. No respiratory distress. She has no wheezes. She has no rales.  Abdominal: Soft.  Bowel sounds are normal. She exhibits no distension and no mass. There is no tenderness. There is no rebound and no guarding.  Musculoskeletal: Normal range of motion. She exhibits no edema.  Lymphadenopathy:    She has no cervical adenopathy.  Neurological: She is alert and oriented to person, place, and time.  CN grossly intact, station and gait intact  Skin: Skin is warm and dry. Rash noted. No erythema.  Confluence of papules into patches on anterior chest and R dorsal hand. Separate raised patch inferior R chin. Mildly pruritic  Psychiatric: She has a normal mood and affect. Her behavior is normal. Judgment and thought content normal.  Nursing note and vitals reviewed.  Results for orders placed or performed in visit on 04/25/16  Cytology - PAP  Result Value Ref Range   Adequacy      Satisfactory for evaluation  endocervical/transformation zone component PRESENT.   Diagnosis      NEGATIVE FOR INTRAEPITHELIAL LESIONS OR MALIGNANCY.   HPV NOT DETECTED    Material Submitted CervicoVaginal Pap [ThinPrep Imaged]    CYTOLOGY - PAP PAP RESULT    Lab Results  Component Value Date   CREATININE 1.22 (H) 03/31/2016   Lab Results  Component Value Date   CHOL 189 12/31/2015   HDL 55.60 12/31/2015   LDLCALC 125 (H) 12/31/2015   TRIG 42.0 12/31/2015   CHOLHDL 3 12/31/2015       Assessment & Plan:   Problem List Items Addressed This Visit    Anemia   Relevant Orders   CBC with Differential/Platelet   Ferritin   IBC panel   Folate   Vitamin B12   Health maintenance examination - Primary    Preventative protocols reviewed and updated unless pt declined. Discussed healthy diet and lifestyle.       Obesity, Class III, BMI 40-49.9 (morbid obesity) (HCC)    Discussed healthy diet and lifestyle changes to affect sustainable weight loss. Encouraged incorporating regular exercise into routine. Reviewed AHA physical activity recommendations for a healthy heart. Discussed restrictive  keto diet and caution with cholesterol and dehydration. Recommended modified low carb [employer] [place] diet.       OSA on CPAP    Compliant with CPAP. Followed by pulm Dr Sung AmabileSimonds.      Rash    Unclear cause - ?tinea corporis vs eczema vs other. rec clotrimazole bid x 2 wks. If no better, may try TCI cream sent to pharmacy.       Renal insufficiency    Update labs when returns      Relevant Orders   Lipid panel   CBC with Differential/Platelet   Renal function panel   Microalbumin / creatinine urine ratio   Vitamin D deficiency    Update when returns for fasting labs.       Relevant Orders   VITAMIN D 25 Hydroxy (Vit-D Deficiency, Fractures)       Follow up plan: Return in about 1 year (around 06/06/2018) for annual exam, prior fasting for blood work.  Eustaquio BoydenJavier Aiko Belko, MD

## 2017-06-06 NOTE — Assessment & Plan Note (Signed)
Unclear cause - ?tinea corporis vs eczema vs other. rec clotrimazole bid x 2 wks. If no better, may try TCI cream sent to pharmacy.

## 2017-06-06 NOTE — Assessment & Plan Note (Signed)
Discussed healthy diet and lifestyle changes to affect sustainable weight loss. Encouraged incorporating regular exercise into routine. Reviewed AHA physical activity recommendations for a healthy heart. Discussed restrictive keto diet and caution with cholesterol and dehydration. Recommended modified low carb [employer] diet.

## 2017-06-06 NOTE — Assessment & Plan Note (Signed)
Update labs when returns

## 2017-06-06 NOTE — Patient Instructions (Addendum)
Look into [employer].  Return at your convenience for fasting labs.  For rash - try over the counter lotrimin antifungal cream twice daily for 1-2 weeks. If no better with this, then start steroid cream sent to pharmacy.  For ears, may try mineral oil drops 1-2 a week to ear canals for itching.  Return as needed or in 1 year for next physical.  Health Maintenance, Female Adopting a healthy lifestyle and getting preventive care can go a long way to promote health and wellness. Talk with your health care provider about what schedule of regular examinations is right for you. This is a good chance for you to check in with your provider about disease prevention and staying healthy. In between checkups, there are plenty of things you can do on your own. Experts have done a lot of research about which lifestyle changes and preventive measures are most likely to keep you healthy. Ask your health care provider for more information. Weight and diet Eat a healthy diet  Be sure to include plenty of vegetables, fruits, low-fat dairy products, and lean protein.  Do not eat a lot of foods high in solid fats, added sugars, or salt.  Get regular exercise. This is one of the most important things you can do for your health. ? Most adults should exercise for at least 150 minutes each week. The exercise should increase your heart rate and make you sweat (moderate-intensity exercise). ? Most adults should also do strengthening exercises at least twice a week. This is in addition to the moderate-intensity exercise.  Maintain a healthy weight  Body mass index (BMI) is a measurement that can be used to identify possible weight problems. It estimates body fat based on height and weight. Your health care provider can help determine your BMI and help you achieve or maintain a healthy weight.  For females 18 years of age and older: ? A BMI below 18.5 is considered underweight. ? A BMI of 18.5 to 24.9 is  normal. ? A BMI of 25 to 29.9 is considered overweight. ? A BMI of 30 and above is considered obese.  Watch levels of cholesterol and blood lipids  You should start having your blood tested for lipids and cholesterol at 37 years of age, then have this test every 5 years.  You may need to have your cholesterol levels checked more often if: ? Your lipid or cholesterol levels are high. ? You are older than 37 years of age. ? You are at high risk for heart disease.  Cancer screening Lung Cancer  Lung cancer screening is recommended for adults 5-36 years old who are at high risk for lung cancer because of a history of smoking.  A yearly low-dose CT scan of the lungs is recommended for people who: ? Currently smoke. ? Have quit within the past 15 years. ? Have at least a 30-pack-year history of smoking. A pack year is smoking an average of one pack of cigarettes a day for 1 year.  Yearly screening should continue until it has been 15 years since you quit.  Yearly screening should stop if you develop a health problem that would prevent you from having lung cancer treatment.  Breast Cancer  Practice breast self-awareness. This means understanding how your breasts normally appear and feel.  It also means doing regular breast self-exams. Let your health care provider know about any changes, no matter how small.  If you are in your 20s or 30s, you  should have a clinical breast exam (CBE) by a health care provider every 1-3 years as part of a regular health exam.  If you are 46 or older, have a CBE every year. Also consider having a breast X-ray (mammogram) every year.  If you have a family history of breast cancer, talk to your health care provider about genetic screening.  If you are at high risk for breast cancer, talk to your health care provider about having an MRI and a mammogram every year.  Breast cancer gene (BRCA) assessment is recommended for women who have family members  with BRCA-related cancers. BRCA-related cancers include: ? Breast. ? Ovarian. ? Tubal. ? Peritoneal cancers.  Results of the assessment will determine the need for genetic counseling and BRCA1 and BRCA2 testing.  Cervical Cancer Your health care provider may recommend that you be screened regularly for cancer of the pelvic organs (ovaries, uterus, and vagina). This screening involves a pelvic examination, including checking for microscopic changes to the surface of your cervix (Pap test). You may be encouraged to have this screening done every 3 years, beginning at age 31.  For women ages 64-65, health care providers may recommend pelvic exams and Pap testing every 3 years, or they may recommend the Pap and pelvic exam, combined with testing for human papilloma virus (HPV), every 5 years. Some types of HPV increase your risk of cervical cancer. Testing for HPV may also be done on women of any age with unclear Pap test results.  Other health care providers may not recommend any screening for nonpregnant women who are considered low risk for pelvic cancer and who do not have symptoms. Ask your health care provider if a screening pelvic exam is right for you.  If you have had past treatment for cervical cancer or a condition that could lead to cancer, you need Pap tests and screening for cancer for at least 20 years after your treatment. If Pap tests have been discontinued, your risk factors (such as having a new sexual partner) need to be reassessed to determine if screening should resume. Some women have medical problems that increase the chance of getting cervical cancer. In these cases, your health care provider may recommend more frequent screening and Pap tests.  Colorectal Cancer  This type of cancer can be detected and often prevented.  Routine colorectal cancer screening usually begins at 37 years of age and continues through 37 years of age.  Your health care provider may recommend  screening at an earlier age if you have risk factors for colon cancer.  Your health care provider may also recommend using home test kits to check for hidden blood in the stool.  A small camera at the end of a tube can be used to examine your colon directly (sigmoidoscopy or colonoscopy). This is done to check for the earliest forms of colorectal cancer.  Routine screening usually begins at age 81.  Direct examination of the colon should be repeated every 5-10 years through 37 years of age. However, you may need to be screened more often if early forms of precancerous polyps or small growths are found.  Skin Cancer  Check your skin from head to toe regularly.  Tell your health care provider about any new moles or changes in moles, especially if there is a change in a mole's shape or color.  Also tell your health care provider if you have a mole that is larger than the size of a pencil eraser.  Always use sunscreen. Apply sunscreen liberally and repeatedly throughout the day.  Protect yourself by wearing long sleeves, pants, a wide-brimmed hat, and sunglasses whenever you are outside.  Heart disease, diabetes, and high blood pressure  High blood pressure causes heart disease and increases the risk of stroke. High blood pressure is more likely to develop in: ? People who have blood pressure in the high end of the normal range (130-139/85-89 mm Hg). ? People who are overweight or obese. ? People who are African American.  If you are 18-39 years of age, have your blood pressure checked every 3-5 years. If you are 40 years of age or older, have your blood pressure checked every year. You should have your blood pressure measured twice-once when you are at a hospital or clinic, and once when you are not at a hospital or clinic. Record the average of the two measurements. To check your blood pressure when you are not at a hospital or clinic, you can use: ? An automated blood pressure machine at  a pharmacy. ? A home blood pressure monitor.  If you are between 55 years and 79 years old, ask your health care provider if you should take aspirin to prevent strokes.  Have regular diabetes screenings. This involves taking a blood sample to check your fasting blood sugar level. ? If you are at a normal weight and have a low risk for diabetes, have this test once every three years after 37 years of age. ? If you are overweight and have a high risk for diabetes, consider being tested at a younger age or more often. Preventing infection Hepatitis B  If you have a higher risk for hepatitis B, you should be screened for this virus. You are considered at high risk for hepatitis B if: ? You were born in a country where hepatitis B is common. Ask your health care provider which countries are considered high risk. ? Your parents were born in a high-risk country, and you have not been immunized against hepatitis B (hepatitis B vaccine). ? You have HIV or AIDS. ? You use needles to inject street drugs. ? You live with someone who has hepatitis B. ? You have had sex with someone who has hepatitis B. ? You get hemodialysis treatment. ? You take certain medicines for conditions, including cancer, organ transplantation, and autoimmune conditions.  Hepatitis C  Blood testing is recommended for: ? Everyone born from 1945 through 1965. ? Anyone with known risk factors for hepatitis C.  Sexually transmitted infections (STIs)  You should be screened for sexually transmitted infections (STIs) including gonorrhea and chlamydia if: ? You are sexually active and are younger than 37 years of age. ? You are older than 37 years of age and your health care provider tells you that you are at risk for this type of infection. ? Your sexual activity has changed since you were last screened and you are at an increased risk for chlamydia or gonorrhea. Ask your health care provider if you are at risk.  If you do  not have HIV, but are at risk, it may be recommended that you take a prescription medicine daily to prevent HIV infection. This is called pre-exposure prophylaxis (PrEP). You are considered at risk if: ? You are sexually active and do not regularly use condoms or know the HIV status of your partner(s). ? You take drugs by injection. ? You are sexually active with a partner who has HIV.  Talk with   your health care provider about whether you are at high risk of being infected with HIV. If you choose to begin PrEP, you should first be tested for HIV. You should then be tested every 3 months for as long as you are taking PrEP. Pregnancy  If you are premenopausal and you may become pregnant, ask your health care provider about preconception counseling.  If you may become pregnant, take 400 to 800 micrograms (mcg) of folic acid every day.  If you want to prevent pregnancy, talk to your health care provider about birth control (contraception). Osteoporosis and menopause  Osteoporosis is a disease in which the bones lose minerals and strength with aging. This can result in serious bone fractures. Your risk for osteoporosis can be identified using a bone density scan.  If you are 94 years of age or older, or if you are at risk for osteoporosis and fractures, ask your health care provider if you should be screened.  Ask your health care provider whether you should take a calcium or vitamin D supplement to lower your risk for osteoporosis.  Menopause may have certain physical symptoms and risks.  Hormone replacement therapy may reduce some of these symptoms and risks. Talk to your health care provider about whether hormone replacement therapy is right for you. Follow these instructions at home:  Schedule regular health, dental, and eye exams.  Stay current with your immunizations.  Do not use any tobacco products including cigarettes, chewing tobacco, or electronic cigarettes.  If you are  pregnant, do not drink alcohol.  If you are breastfeeding, limit how much and how often you drink alcohol.  Limit alcohol intake to no more than 1 drink per day for nonpregnant women. One drink equals 12 ounces of beer, 5 ounces of wine, or 1 ounces of hard liquor.  Do not use street drugs.  Do not share needles.  Ask your health care provider for help if you need support or information about quitting drugs.  Tell your health care provider if you often feel depressed.  Tell your health care provider if you have ever been abused or do not feel safe at home. This information is not intended to replace advice given to you by your health care provider. Make sure you discuss any questions you have with your health care provider. Document Released: 01/03/2011 Document Revised: 11/26/2015 Document Reviewed: 03/24/2015 Elsevier Interactive Patient Education  [employer].

## 2017-06-06 NOTE — Assessment & Plan Note (Signed)
Preventative protocols reviewed and updated unless pt declined. Discussed healthy diet and lifestyle.  

## 2017-06-06 NOTE — Assessment & Plan Note (Signed)
Compliant with CPAP. Followed by pulm Dr Sung AmabileSimonds.

## 2017-06-06 NOTE — Assessment & Plan Note (Signed)
Update when returns for fasting labs.

## 2017-07-25 ENCOUNTER — Telehealth: Payer: Self-pay | Admitting: Family Medicine

## 2017-07-25 NOTE — Telephone Encounter (Signed)
Pt will ck with [facility] [address] to pick up triamcinolone cream. Even thou they put med back on shelf the rx is still there for refill. Pt voiced understanding and will ck with pharmacy.

## 2017-07-25 NOTE — Telephone Encounter (Signed)
Pt says that cream was sent in to her pharmacy for her. Pt says that she never picked It up because she was trying otc medications. Pt says that the otc is not working so she would like to pick up medication. Pt says that she now need Rx to be resent to her pharmacy, after a certain time they put the Rx back.    Please assist further.

## 2017-08-09 ENCOUNTER — Other Ambulatory Visit (INDEPENDENT_AMBULATORY_CARE_PROVIDER_SITE_OTHER): Payer: 59

## 2017-08-09 DIAGNOSIS — D631 Anemia in chronic kidney disease: Secondary | ICD-10-CM | POA: Diagnosis not present

## 2017-08-09 DIAGNOSIS — N183 Chronic kidney disease, stage 3 (moderate): Secondary | ICD-10-CM

## 2017-08-09 DIAGNOSIS — E559 Vitamin D deficiency, unspecified: Secondary | ICD-10-CM | POA: Diagnosis not present

## 2017-08-09 DIAGNOSIS — N289 Disorder of kidney and ureter, unspecified: Secondary | ICD-10-CM | POA: Diagnosis not present

## 2017-08-09 LAB — CBC WITH DIFFERENTIAL/PLATELET
BASOS PCT: 0.3 % (ref 0.0–3.0)
Basophils Absolute: 0 10*3/uL (ref 0.0–0.1)
EOS ABS: 0.1 10*3/uL (ref 0.0–0.7)
Eosinophils Relative: 0.9 % (ref 0.0–5.0)
HCT: 37.1 % (ref 36.0–46.0)
HEMOGLOBIN: 12.2 g/dL (ref 12.0–15.0)
LYMPHS ABS: 2.2 10*3/uL (ref 0.7–4.0)
Lymphocytes Relative: 34 % (ref 12.0–46.0)
MCHC: 32.8 g/dL (ref 30.0–36.0)
MCV: 83.9 fl (ref 78.0–100.0)
MONO ABS: 0.5 10*3/uL (ref 0.1–1.0)
Monocytes Relative: 6.8 % (ref 3.0–12.0)
NEUTROS PCT: 58 % (ref 43.0–77.0)
Neutro Abs: 3.8 10*3/uL (ref 1.4–7.7)
Platelets: 283 10*3/uL (ref 150.0–400.0)
RBC: 4.42 Mil/uL (ref 3.87–5.11)
RDW: 16.2 % — AB (ref 11.5–15.5)
WBC: 6.6 10*3/uL (ref 4.0–10.5)

## 2017-08-09 LAB — FOLATE: FOLATE: 7.2 ng/mL (ref >5.9)

## 2017-08-09 LAB — VITAMIN D 25 HYDROXY (VIT D DEFICIENCY, FRACTURES): VITD: 20.65 ng/mL — ABNORMAL LOW (ref 30.00–100.00)

## 2017-08-09 LAB — IBC PANEL
IRON: 36 ug/dL — AB (ref 42–145)
SATURATION RATIOS: 9.1 % — AB (ref 20.0–50.0)
TRANSFERRIN: 283 mg/dL (ref 212.0–360.0)

## 2017-08-09 LAB — LIPID PANEL
Cholesterol: 187 mg/dL (ref 0–200)
HDL: 49.9 mg/dL (ref >39.00)
LDL CALC: 125 mg/dL — AB (ref 0–99)
NONHDL: 136.68
Total CHOL/HDL Ratio: 4
Triglycerides: 56 mg/dL (ref 0.0–149.0)
VLDL: 11.2 mg/dL (ref 0.0–40.0)

## 2017-08-09 LAB — RENAL FUNCTION PANEL
ALBUMIN: 3.8 g/dL (ref 3.5–5.2)
BUN: 17 mg/dL (ref 6–23)
CHLORIDE: 106 meq/L (ref 96–112)
CO2: 29 meq/L (ref 19–32)
Calcium: 9.2 mg/dL (ref 8.4–10.5)
Creatinine, Ser: 1.23 mg/dL — ABNORMAL HIGH (ref 0.40–1.20)
GFR: 51.96 mL/min — ABNORMAL LOW (ref >60.00)
Glucose, Bld: 85 mg/dL (ref 70–99)
POTASSIUM: 4.5 meq/L (ref 3.5–5.1)
Phosphorus: 3.9 mg/dL (ref 2.3–4.6)
Sodium: 142 mEq/L (ref 135–145)

## 2017-08-09 LAB — MICROALBUMIN / CREATININE URINE RATIO
Creatinine,U: 129.8 mg/dL
Microalb Creat Ratio: 0.5 mg/g (ref 0.0–30.0)

## 2017-08-09 LAB — VITAMIN B12: Vitamin B-12: 473 pg/mL (ref 211–911)

## 2017-08-09 LAB — FERRITIN: FERRITIN: 54.2 ng/mL (ref 10.0–291.0)

## 2017-08-12 ENCOUNTER — Encounter: Payer: Self-pay | Admitting: Family Medicine

## 2017-08-12 ENCOUNTER — Other Ambulatory Visit: Payer: Self-pay | Admitting: Family Medicine

## 2017-08-12 DIAGNOSIS — E611 Iron deficiency: Secondary | ICD-10-CM | POA: Insufficient documentation

## 2017-08-12 MED ORDER — FERROUS SULFATE 325 (65 FE) MG PO TABS: 325.0000 mg | ORAL_TABLET | Freq: Every day | ORAL | 3 refills | Status: DC

## 2017-08-12 MED ORDER — VITAMIN D 50 MCG (2000 UT) PO CAPS: 1.0000 | ORAL_CAPSULE | Freq: Every day | ORAL | Status: DC

## 2017-08-28 ENCOUNTER — Ambulatory Visit: Payer: 59 | Admitting: Pulmonary Disease

## 2017-09-28 ENCOUNTER — Ambulatory Visit: Payer: 59 | Admitting: Pulmonary Disease

## 2017-09-28 ENCOUNTER — Encounter: Payer: Self-pay | Admitting: Pulmonary Disease

## 2017-09-28 VITALS — BP 122/80 | HR 82 | Ht 64.25 in | Wt 277.0 lb

## 2017-09-28 DIAGNOSIS — E66813 Obesity, class 3: Secondary | ICD-10-CM

## 2017-09-28 DIAGNOSIS — G4733 Obstructive sleep apnea (adult) (pediatric): Secondary | ICD-10-CM

## 2017-09-28 NOTE — Patient Instructions (Signed)
Continue CPAP as previously prescribed.  I would like to see you compliant with CPAP therapy more consistently  We discussed weight loss strategies and in particular removing processed sugars from your diet  Follow-up in 6 months with a target weight of 260 pounds

## 2017-09-28 NOTE — Progress Notes (Signed)
PULMONARY OFFICE FOLLOW UP NOTE  Requesting MD/Service: Guitierrez Date of initial consultation: 01/29/16 Reason for consultation: snoring, hypersomnolence  PT PROFILE: [age] F with obesity, snoring, daytime hypersomnolence.   DATA: 02/15/16 Home sleep study: AHI 14/hr, lowest SpO2 79%. Autoset CPAP initiated 10/02-10/31/17 CPAP compliance: Usage 24/30 days, > 4 hours 22 days.  02/25-03/26/19 Compliance report: Usage 19/30 das, > 4 hrs 19 days,   SUBJ:  This is a routine reevaluation.  She has no new complaints.  She denies daytime hypersomnolence.  She is not wearing CPAP consistently as documented by the usage reported above.  She indicates that this is because sometimes she falls asleep without it.  She has made some lifestyle changes and in particular dietary changes in an effort at weight loss.  However she continues to consume significant amounts of processed sugar in the form of sodas.  OBJ:  Vitals:   09/28/17 0828 09/28/17 0829  BP:  122/80  Pulse:  82  SpO2:  99%  Weight: 277 lb (125.6 kg)   Height: 5' 4.25" (1.632 m)     EXAM:  Gen: Obese, pleasant, no distress HEENT: NCAT, sclerae white, oropharynx normal Neck: Supple without lymphadenopathy Lungs: Full breath sounds without wheezes or other adventitious sounds Cardiovascular: RRR, no M Abdomen: Obese, soft, nontender Ext: No clubbing, cyanosis, edema Neuro: No focal deficits   CXR:  NNF  IMPRESSION:   Mild OSA (AHI 14) with daytime somnolence - much improved with CPAP Obesity severe   PLAN:  -Continue CPAP, AutoSet 5-15 -We discussed the importance of weight loss in terms of her overall health as well as the problem of obstructive sleep apnea.  We reviewed her dietary habits.  She is very intelligent and has made some important changes but has had difficulty eliminating sugar sweetened sodas from her diet.  I emphasized the importance of eliminating all simple sugars and especially processed sugars from her  diet. -Follow-up in 6 months with a target weight of 260 pounds  Billy Fischeravid Ravon Mcilhenny, MD PCCM service Mobile [phone] Pager [phone] 09/28/2017 8:53 AM

## 2017-10-17 ENCOUNTER — Telehealth: Payer: Self-pay | Admitting: Family Medicine

## 2017-10-17 ENCOUNTER — Encounter: Payer: Self-pay | Admitting: Family Medicine

## 2017-10-17 MED ORDER — CLOBETASOL PROPIONATE 0.05 % EX CREA: 1.0000 "application " | TOPICAL_CREAM | Freq: Two times a day (BID) | CUTANEOUS | 0 refills | Status: AC

## 2017-10-17 NOTE — Telephone Encounter (Signed)
Copied from CRM [phone]. Topic: Quick Communication - Rx Refill/Question >> Oct 17, 2017  1:42 PM Alexander BergeronBarksdale, Harvey B wrote: Medication: triamcinolone cream (KENALOG) 0.1 % [[id number]][[id number]]  Pt  called to state the medication above is not working and is trying to see if a different medication can be prescribed for her, call pt to advise

## 2017-10-17 NOTE — Telephone Encounter (Signed)
Pt last seen 06/06/17;see pt email 10/17/17.

## 2017-10-17 NOTE — Telephone Encounter (Signed)
Have sent in a more potent steroid cream. Don't use on face or underarms or groin.  If no better with treatment, let us know for derm referral.

## 2017-10-18 NOTE — Telephone Encounter (Signed)
Left message on vm per dpr relaying Dr. G's message.  

## 2018-02-23 IMAGING — US US RENAL
1 series · 14 of 25 positions shown · non-contrast
Comparison: None.

CLINICAL DATA: History of diabetes and hypertension.

EXAM:
RENAL / URINARY TRACT ULTRASOUND COMPLETE

[Series 1: us renal · 0.28mm/px · 14 of 32 slices shown]
[im 1/32]
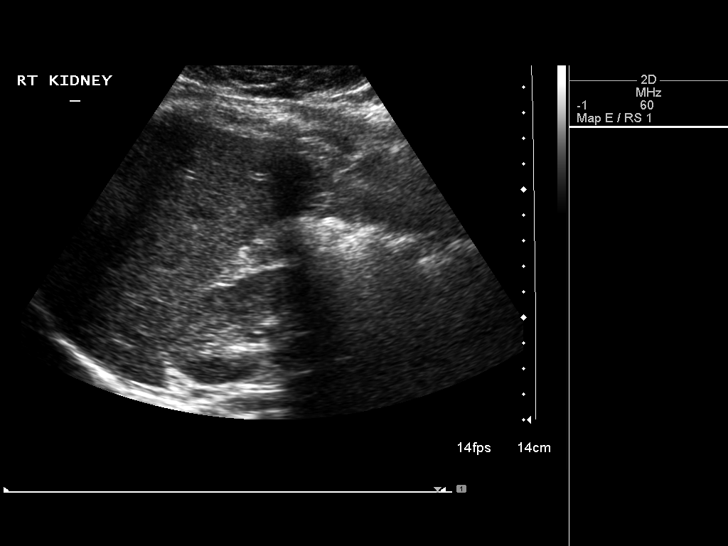
[im 3/32]
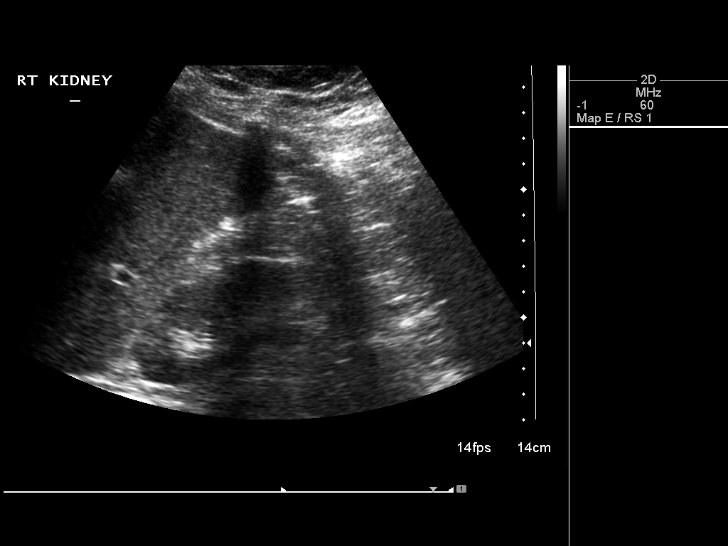
[im 6/32]
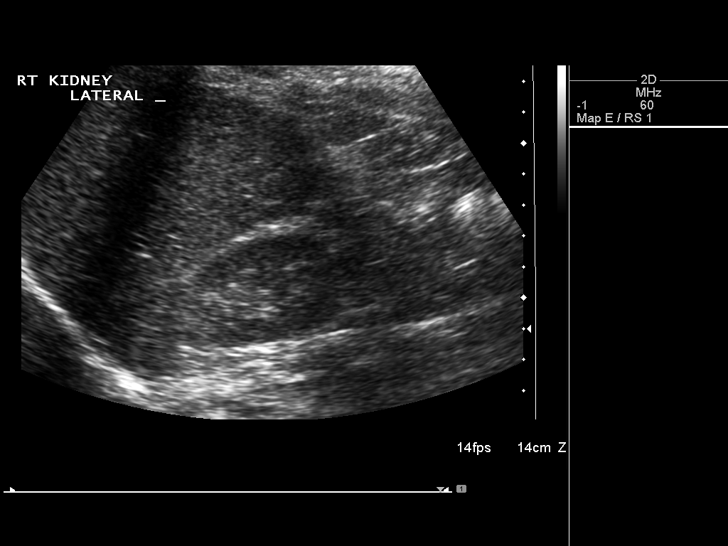
[im 8/32]
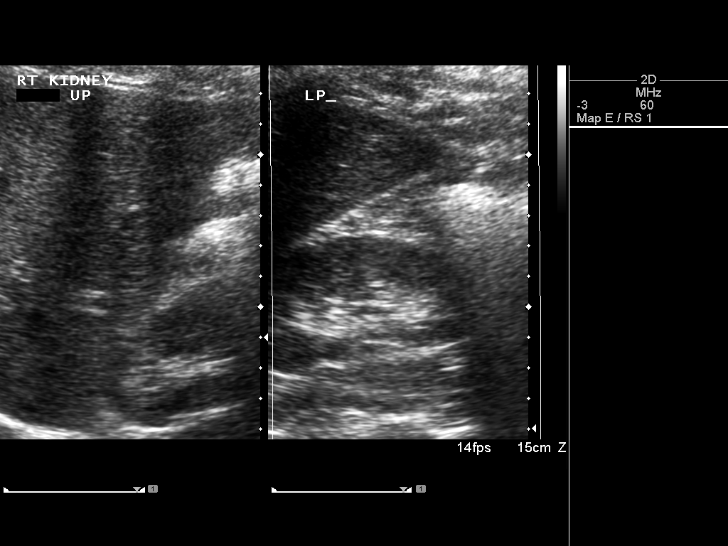
[im 11/32]
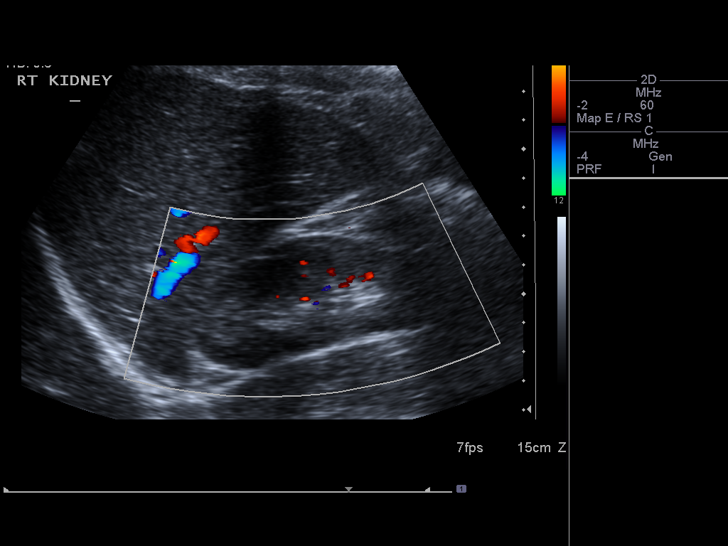
[im 12/32]
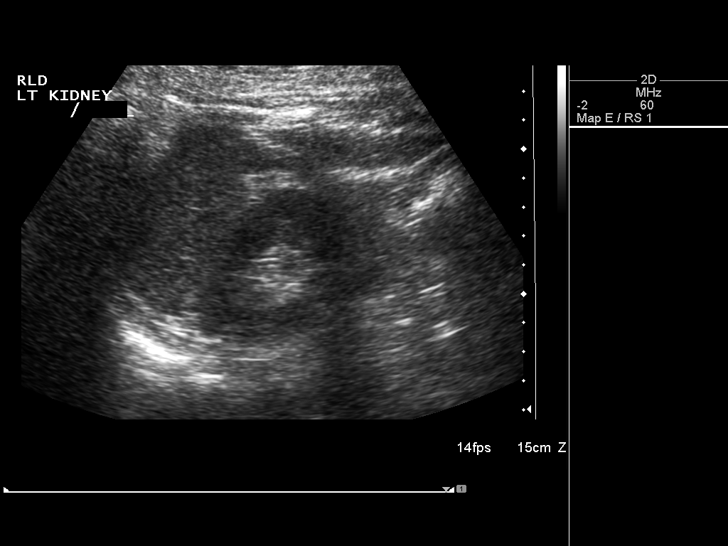
[im 15/32]
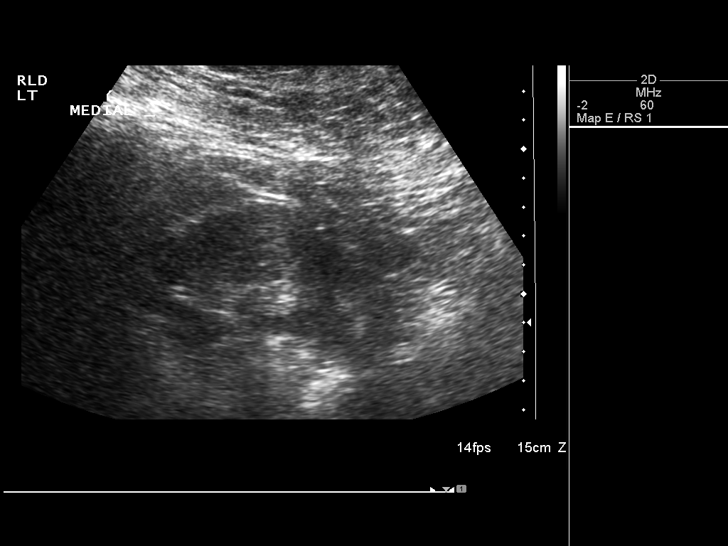
[im 17/32]
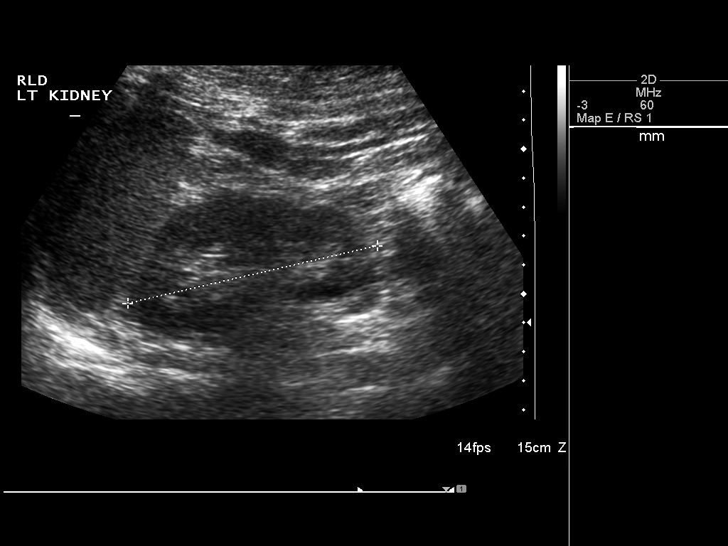
[im 20/32]
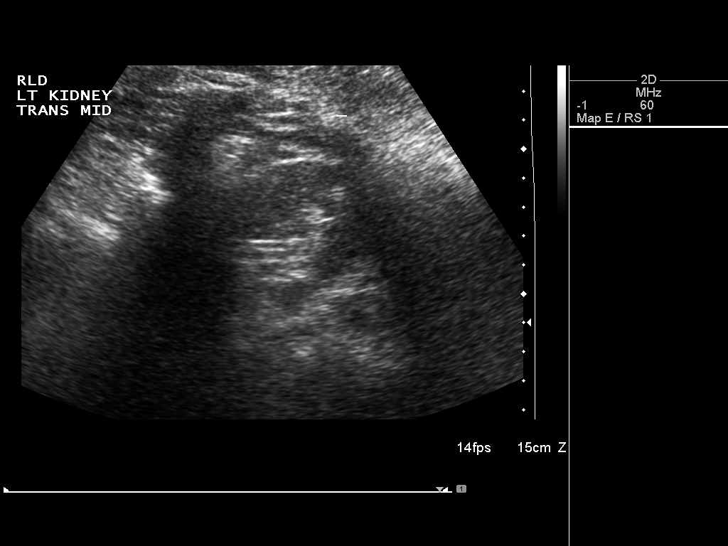
[im 21/32]
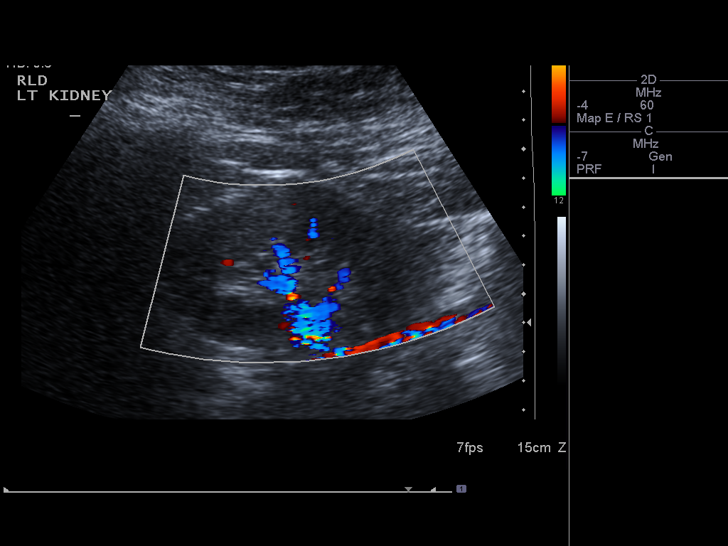
[im 24/32]
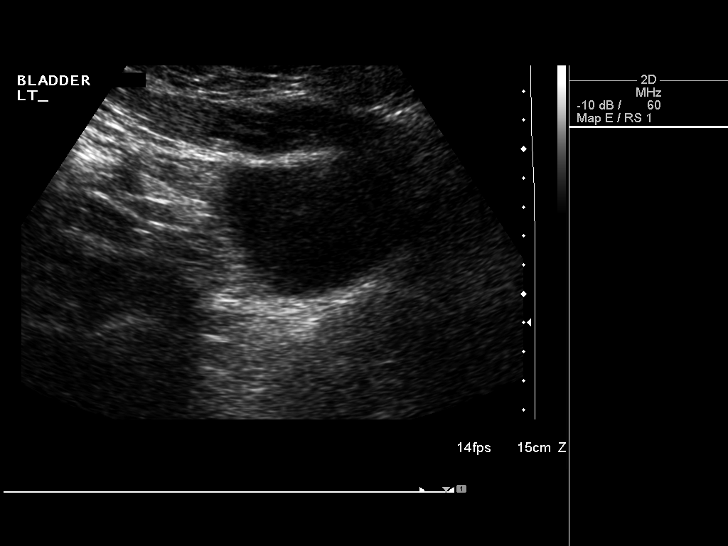
[im 26/32]
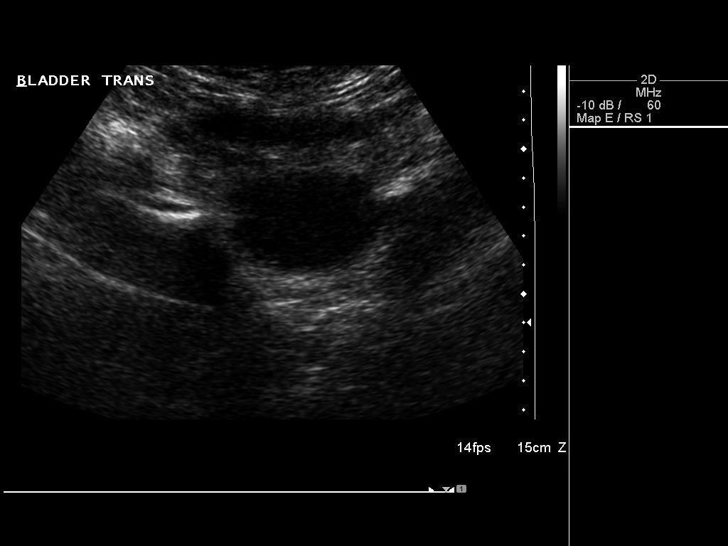
[im 29/32]
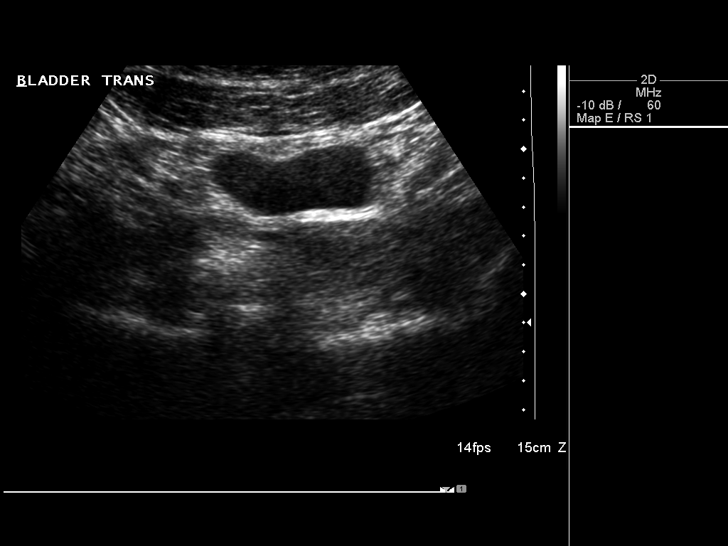
[im 32/32]
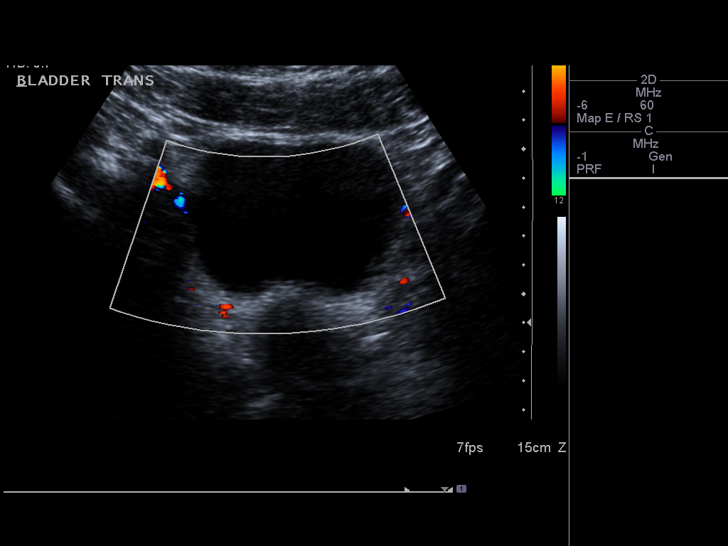

[14 of 25 positions shown; findings below may reference images not displayed]

FINDINGS: Right Kidney:

Length: 10.5 cm. Echogenicity within normal limits. No mass or
hydronephrosis visualized.

Left Kidney:

Length: 8.9 cm. Echogenicity within normal limits. No mass or
hydronephrosis visualized.

Bladder:

Appears normal for degree of bladder distention.
IMPRESSION: Normal renal ultrasound.

## 2018-03-09 DIAGNOSIS — G4733 Obstructive sleep apnea (adult) (pediatric): Secondary | ICD-10-CM | POA: Diagnosis not present

## 2019-11-21 ENCOUNTER — Other Ambulatory Visit: Payer: Self-pay | Admitting: Family Medicine

## 2019-11-21 DIAGNOSIS — N289 Disorder of kidney and ureter, unspecified: Secondary | ICD-10-CM

## 2019-11-21 DIAGNOSIS — N183 Chronic kidney disease, stage 3 unspecified: Secondary | ICD-10-CM

## 2019-11-21 DIAGNOSIS — E611 Iron deficiency: Secondary | ICD-10-CM

## 2019-11-21 DIAGNOSIS — E559 Vitamin D deficiency, unspecified: Secondary | ICD-10-CM

## 2019-11-21 DIAGNOSIS — D631 Anemia in chronic kidney disease: Secondary | ICD-10-CM

## 2019-11-26 ENCOUNTER — Other Ambulatory Visit (INDEPENDENT_AMBULATORY_CARE_PROVIDER_SITE_OTHER): Payer: 59

## 2019-11-26 DIAGNOSIS — D631 Anemia in chronic kidney disease: Secondary | ICD-10-CM | POA: Diagnosis not present

## 2019-11-26 DIAGNOSIS — N289 Disorder of kidney and ureter, unspecified: Secondary | ICD-10-CM

## 2019-11-26 DIAGNOSIS — N183 Chronic kidney disease, stage 3 unspecified: Secondary | ICD-10-CM

## 2019-11-26 DIAGNOSIS — E611 Iron deficiency: Secondary | ICD-10-CM | POA: Diagnosis not present

## 2019-11-26 DIAGNOSIS — E559 Vitamin D deficiency, unspecified: Secondary | ICD-10-CM

## 2019-11-26 LAB — CBC WITH DIFFERENTIAL/PLATELET
Basophils Absolute: 0 10*3/uL (ref 0.0–0.1)
Basophils Relative: 0.3 % (ref 0.0–3.0)
Eosinophils Absolute: 0.1 10*3/uL (ref 0.0–0.7)
Eosinophils Relative: 1.6 % (ref 0.0–5.0)
HCT: 35.1 % — ABNORMAL LOW (ref 36.0–46.0)
Hemoglobin: 11.4 g/dL — ABNORMAL LOW (ref 12.0–15.0)
Lymphocytes Relative: 43.4 % (ref 12.0–46.0)
Lymphs Abs: 2.8 10*3/uL (ref 0.7–4.0)
MCHC: 32.5 g/dL (ref 30.0–36.0)
MCV: 77.9 fl — ABNORMAL LOW (ref 78.0–100.0)
Monocytes Absolute: 0.4 10*3/uL (ref 0.1–1.0)
Monocytes Relative: 5.9 % (ref 3.0–12.0)
Neutro Abs: 3.1 10*3/uL (ref 1.4–7.7)
Neutrophils Relative %: 48.8 % (ref 43.0–77.0)
Platelets: 277 10*3/uL (ref 150.0–400.0)
RBC: 4.51 Mil/uL (ref 3.87–5.11)
RDW: 18.6 % — ABNORMAL HIGH (ref 11.5–15.5)
WBC: 6.4 10*3/uL (ref 4.0–10.5)

## 2019-11-26 LAB — COMPREHENSIVE METABOLIC PANEL
ALT: 9 U/L (ref 0–35)
AST: 15 U/L (ref 0–37)
Albumin: 3.8 g/dL (ref 3.5–5.2)
Alkaline Phosphatase: 112 U/L (ref 39–117)
BUN: 13 mg/dL (ref 6–23)
CO2: 29 mEq/L (ref 19–32)
Calcium: 9.2 mg/dL (ref 8.4–10.5)
Chloride: 103 mEq/L (ref 96–112)
Creatinine, Ser: 1.17 mg/dL (ref 0.40–1.20)
GFR: 51.17 mL/min — ABNORMAL LOW (ref >60.00)
Glucose, Bld: 96 mg/dL (ref 70–99)
Potassium: 4.3 mEq/L (ref 3.5–5.1)
Sodium: 138 mEq/L (ref 135–145)
Total Bilirubin: 0.5 mg/dL (ref 0.2–1.2)
Total Protein: 7.5 g/dL (ref 6.0–8.3)

## 2019-11-26 LAB — MICROALBUMIN / CREATININE URINE RATIO
Creatinine,U: 91.8 mg/dL
Microalb Creat Ratio: 0.8 mg/g (ref 0.0–30.0)
Microalb, Ur: <0.7 mg/dL (ref 0.0–1.9)

## 2019-11-26 LAB — LIPID PANEL
Cholesterol: 184 mg/dL (ref 0–200)
HDL: 49.4 mg/dL (ref >39.00)
LDL Cholesterol: 126 mg/dL — ABNORMAL HIGH (ref 0–99)
NonHDL: 134.9
Total CHOL/HDL Ratio: 4
Triglycerides: 47 mg/dL (ref 0.0–149.0)
VLDL: 9.4 mg/dL (ref 0.0–40.0)

## 2019-11-26 LAB — IBC PANEL
Iron: 48 ug/dL (ref 42–145)
Saturation Ratios: 12.2 % — ABNORMAL LOW (ref 20.0–50.0)
Transferrin: 281 mg/dL (ref 212.0–360.0)

## 2019-11-26 LAB — VITAMIN D 25 HYDROXY (VIT D DEFICIENCY, FRACTURES): VITD: 13.19 ng/mL — ABNORMAL LOW (ref 30.00–100.00)

## 2019-11-26 LAB — FERRITIN: Ferritin: 25.7 ng/mL (ref 10.0–291.0)

## 2019-12-03 ENCOUNTER — Other Ambulatory Visit: Payer: Self-pay

## 2019-12-03 ENCOUNTER — Ambulatory Visit (INDEPENDENT_AMBULATORY_CARE_PROVIDER_SITE_OTHER): Payer: 59 | Admitting: Family Medicine

## 2019-12-03 ENCOUNTER — Encounter: Payer: Self-pay | Admitting: Family Medicine

## 2019-12-03 VITALS — BP 130/76 | HR 89 | Temp 97.8°F | Ht 64.0 in | Wt 294.1 lb

## 2019-12-03 DIAGNOSIS — Z113 Encounter for screening for infections with a predominantly sexual mode of transmission: Secondary | ICD-10-CM | POA: Diagnosis not present

## 2019-12-03 DIAGNOSIS — G4733 Obstructive sleep apnea (adult) (pediatric): Secondary | ICD-10-CM

## 2019-12-03 DIAGNOSIS — Z6841 Body Mass Index (BMI) 40.0 and over, adult: Secondary | ICD-10-CM

## 2019-12-03 DIAGNOSIS — D631 Anemia in chronic kidney disease: Secondary | ICD-10-CM

## 2019-12-03 DIAGNOSIS — N912 Amenorrhea, unspecified: Secondary | ICD-10-CM

## 2019-12-03 DIAGNOSIS — Z0001 Encounter for general adult medical examination with abnormal findings: Secondary | ICD-10-CM

## 2019-12-03 DIAGNOSIS — Z9989 Dependence on other enabling machines and devices: Secondary | ICD-10-CM

## 2019-12-03 DIAGNOSIS — E559 Vitamin D deficiency, unspecified: Secondary | ICD-10-CM

## 2019-12-03 DIAGNOSIS — N1831 Chronic kidney disease, stage 3a: Secondary | ICD-10-CM

## 2019-12-03 DIAGNOSIS — N183 Chronic kidney disease, stage 3 unspecified: Secondary | ICD-10-CM

## 2019-12-03 DIAGNOSIS — M25511 Pain in right shoulder: Secondary | ICD-10-CM

## 2019-12-03 LAB — POC URINALSYSI DIPSTICK (AUTOMATED)
Bilirubin, UA: NEGATIVE
Blood, UA: NEGATIVE
Glucose, UA: NEGATIVE
Ketones, UA: NEGATIVE
Leukocytes, UA: NEGATIVE
Nitrite, UA: NEGATIVE
Protein, UA: NEGATIVE
Spec Grav, UA: >=1.03 — AB (ref 1.010–1.025)
Urobilinogen, UA: 0.2 E.U./dL
pH, UA: 6 (ref 5.0–8.0)

## 2019-12-03 MED ORDER — VITAMIN D3 1.25 MG (50000 UT) PO TABS: 1.0000 | ORAL_TABLET | ORAL | 1 refills | Status: DC

## 2019-12-03 NOTE — Assessment & Plan Note (Signed)
Start 50k weekly dosing for 6 months then start 2000 IU daily.

## 2019-12-03 NOTE — Assessment & Plan Note (Signed)
Preventative protocols reviewed and updated unless pt declined. Discussed healthy diet and lifestyle.  

## 2019-12-03 NOTE — Patient Instructions (Addendum)
Call to schedule well woman exam.  You are doing well today. Return as needed or in 1-2 year for next physical.  Increase water intake.  Start vitamin D weekly replacement for 6 months then start 2000 units daily.  Kidneys remain mildly impaired but stable.  You are mildly anemic- check urinalysis today and pass by lab to pick up stool kit. Labs today.   Health Maintenance, Female Adopting a healthy lifestyle and getting preventive care are important in promoting health and wellness. Ask your health care provider about:  The right schedule for you to have regular tests and exams.  Things you can do on your own to prevent diseases and keep yourself healthy. What should I know about diet, weight, and exercise? Eat a healthy diet   Eat a diet that includes plenty of vegetables, fruits, low-fat dairy products, and lean protein.  Do not eat a lot of foods that are high in solid fats, added sugars, or sodium. Maintain a healthy weight Body mass index (BMI) is used to identify weight problems. It estimates body fat based on height and weight. Your health care provider can help determine your BMI and help you achieve or maintain a healthy weight. Get regular exercise Get regular exercise. This is one of the most important things you can do for your health. Most adults should:  Exercise for at least 150 minutes each week. The exercise should increase your heart rate and make you sweat (moderate-intensity exercise).  Do strengthening exercises at least twice a week. This is in addition to the moderate-intensity exercise.  Spend less time sitting. Even light physical activity can be beneficial. Watch cholesterol and blood lipids Have your blood tested for lipids and cholesterol at 40 years of age, then have this test every 5 years. Have your cholesterol levels checked more often if:  Your lipid or cholesterol levels are high.  You are older than 40 years of age.  You are at high risk for  heart disease. What should I know about cancer screening? Depending on your health history and family history, you may need to have cancer screening at various ages. This may include screening for:  Breast cancer.  Cervical cancer.  Colorectal cancer.  Skin cancer.  Lung cancer. What should I know about heart disease, diabetes, and high blood pressure? Blood pressure and heart disease  High blood pressure causes heart disease and increases the risk of stroke. This is more likely to develop in people who have high blood pressure readings, are of African descent, or are overweight.  Have your blood pressure checked: ? Every 3-5 years if you are 92-30 years of age. ? Every year if you are 42 years old or older. Diabetes Have regular diabetes screenings. This checks your fasting blood sugar level. Have the screening done:  Once every three years after age 78 if you are at a normal weight and have a low risk for diabetes.  More often and at a younger age if you are overweight or have a high risk for diabetes. What should I know about preventing infection? Hepatitis B If you have a higher risk for hepatitis B, you should be screened for this virus. Talk with your health care provider to find out if you are at risk for hepatitis B infection. Hepatitis C Testing is recommended for:  Everyone born from [birth year] through 1966.  Anyone with known risk factors for hepatitis C. Sexually transmitted infections (STIs)  Get screened for STIs, including gonorrhea and  chlamydia, if: ? You are sexually active and are younger than 40 years of age. ? You are older than 40 years of age and your health care provider tells you that you are at risk for this type of infection. ? Your sexual activity has changed since you were last screened, and you are at increased risk for chlamydia or gonorrhea. Ask your health care provider if you are at risk.  Ask your health care provider about whether you are at  high risk for HIV. Your health care provider may recommend a prescription medicine to help prevent HIV infection. If you choose to take medicine to prevent HIV, you should first get tested for HIV. You should then be tested every 3 months for as long as you are taking the medicine. Pregnancy  If you are about to stop having your period (premenopausal) and you may become pregnant, seek counseling before you get pregnant.  Take 400 to 800 micrograms (mcg) of folic acid every day if you become pregnant.  Ask for birth control (contraception) if you want to prevent pregnancy. Osteoporosis and menopause Osteoporosis is a disease in which the bones lose minerals and strength with aging. This can result in bone fractures. If you are 57 years old or older, or if you are at risk for osteoporosis and fractures, ask your health care provider if you should:  Be screened for bone loss.  Take a calcium or vitamin D supplement to lower your risk of fractures.  Be given hormone replacement therapy (HRT) to treat symptoms of menopause. Follow these instructions at home: Lifestyle  Do not use any products that contain nicotine or tobacco, such as cigarettes, e-cigarettes, and chewing tobacco. If you need help quitting, ask your health care provider.  Do not use street drugs.  Do not share needles.  Ask your health care provider for help if you need support or information about quitting drugs. Alcohol use  Do not drink alcohol if: ? Your health care provider tells you not to drink. ? You are pregnant, may be pregnant, or are planning to become pregnant.  If you drink alcohol: ? Limit how much you use to 0-1 drink a day. ? Limit intake if you are breastfeeding.  Be aware of how much alcohol is in your drink. In the [place] one drink equals one 12 oz bottle of beer (355 mL), one 5 oz glass of wine (148 mL), or one 1 oz glass of hard liquor (44 mL). General instructions  Schedule regular health,  dental, and eye exams.  Stay current with your vaccines.  Tell your health care provider if: ? You often feel depressed. ? You have ever been abused or do not feel safe at home. Summary  Adopting a healthy lifestyle and getting preventive care are important in promoting health and wellness.  Follow your health care provider's instructions about healthy diet, exercising, and getting tested or screened for diseases.  Follow your health care provider's instructions on monitoring your cholesterol and blood pressure. This information is not intended to replace advice given to you by your health care provider. Make sure you discuss any questions you have with your health care provider. Document Revised: 06/13/2018 Document Reviewed: 06/13/2018 Elsevier Patient Education  2020 [employer].

## 2019-12-03 NOTE — Assessment & Plan Note (Addendum)
Previously saw Dr Sung Amabile. Does not want to use CPAP at this time.

## 2019-12-03 NOTE — Assessment & Plan Note (Signed)
Concern for premature ovarian failure. Did not go to [facility]. Encouraged GYN f/u.

## 2019-12-03 NOTE — Assessment & Plan Note (Signed)
Thought related to CKD. Iron % saturation mildly low, ferritin 25. Check iFOB and UA.

## 2019-12-03 NOTE — Assessment & Plan Note (Signed)
Chronic. Normal microalbumin. Normal renal US 2017. Encouraged increasing water intake.

## 2019-12-03 NOTE — Assessment & Plan Note (Addendum)
Encouraged healthy diet and lifestyle choices to affect sustainable weight loss. She plans to start regular exercise routine using home elliptical.

## 2019-12-03 NOTE — Progress Notes (Addendum)
This visit was conducted in person.  BP 130/76 (BP Location: Right Arm, Patient Position: Sitting, Cuff Size: Large)   Pulse 89   Temp 97.8 F (36.6 C) (Temporal)   Ht _0  (1.626 m)   Wt 294 lb 1 oz (133.4 kg)   LMP  (Within Months) Comment: 03/2019  SpO2 97%   BMI 50.48 kg/m    CC: CPE Subjective:    Patient ID: Courtney Jones, female    DOB: 04/28/1981, 39 y.o.   MRN: [id number]  HPI: Courtney Jones is a 39 y.o. female presenting on 12/03/2019 for Annual Exam   Mild OSA previously on CPAP. Has not been using CPAP. Ongoing snoring, no PNDyspnea. No daytime sleepiness. Would like to stay off CPAP machine at this time.   Ongoing R shoulder pain for months, no inciting trauma. It has improved some.   Preventative: Well woman Dr Harolyn Rutherford - normal paps, h/o HPV. Last well woman was 04/2016. Will call to schedule well woman exam.  Last sexually active several years ago, found out to her surprise it was not a monogamous relationship - requests STD blood work. May get CT/GC at upcoming well woman exam.  LMP last year - told may have premature menopause Flu shot yearly COVID vaccines - completed Pfizer vaccines 09/2019  Tdap 2011  Seat belt use discussed Sunscreen use discussed. No changing moles on skin.  Non smoker Alcohol - rare Dentist q6 mo  Eye exam yearly   Lives alone, no pets  Edu: college Astron[employer])  Occupation: Hospital doctor Activity: elliptical at home  Diet: good water, fruits/vegetables daily      Relevant past medical, surgical, family and social history reviewed and updated as indicated. Interim medical history since our last visit reviewed. Allergies and medications reviewed and updated. Outpatient Medications Prior to Visit  Medication Sig Dispense Refill  . Cholecalciferol (VITAMIN D) 2000 units CAPS Take 1 capsule (2,000 Units total) by mouth daily. 30 capsule   . ferrous sulfate 325 (65 FE) MG tablet Take 1 tablet (325 mg  total) by mouth daily with breakfast.  3   No facility-administered medications prior to visit.     Per HPI unless specifically indicated in ROS section below Review of Systems  Constitutional: Negative for activity change, appetite change, chills, fatigue, fever and unexpected weight change.  HENT: Negative for hearing loss.   Eyes: Negative for visual disturbance.  Respiratory: Negative for cough, chest tightness, shortness of breath and wheezing.   Cardiovascular: Negative for chest pain, palpitations and leg swelling.  Gastrointestinal: Negative for abdominal distention, abdominal pain, blood in stool, constipation, diarrhea, nausea and vomiting.  Genitourinary: Negative for difficulty urinating and hematuria.  Musculoskeletal: Negative for arthralgias, myalgias and neck pain.  Skin: Negative for rash.  Neurological: Negative for dizziness, seizures, syncope and headaches.  Hematological: Negative for adenopathy. Does not bruise/bleed easily.  Psychiatric/Behavioral: Negative for dysphoric mood. The patient is not nervous/anxious.    Objective:  BP 130/76 (BP Location: Right Arm, Patient Position: Sitting, Cuff Size: Large)   Pulse 89   Temp 97.8 F (36.6 C) (Temporal)   Ht _1  (1.626 m)   Wt 294 lb 1 oz (133.4 kg)   LMP  (Within Months) Comment: 03/2019  SpO2 97%   BMI 50.48 kg/m   Wt Readings from Last 3 Encounters:  12/03/19 294 lb 1 oz (133.4 kg)  09/28/17 277 lb (125.6 kg)  06/06/17 275 lb (124.7 kg)  Physical Exam Vitals and nursing note reviewed.  Constitutional:      General: She is not in acute distress.    Appearance: Normal appearance. She is well-developed. She is not ill-appearing.  HENT:     Head: Normocephalic and atraumatic.     Right Ear: Hearing, tympanic membrane, ear canal and external ear normal.     Left Ear: Hearing, tympanic membrane, ear canal and external ear normal.  Eyes:     General: No scleral icterus.    Extraocular Movements:  Extraocular movements intact.     Conjunctiva/sclera: Conjunctivae normal.     Pupils: Pupils are equal, round, and reactive to light.  Cardiovascular:     Rate and Rhythm: Normal rate and regular rhythm.     Pulses: Normal pulses.          Radial pulses are 2+ on the right side and 2+ on the left side.     Heart sounds: Normal heart sounds. No murmur.  Pulmonary:     Effort: Pulmonary effort is normal. No respiratory distress.     Breath sounds: Normal breath sounds. No wheezing, rhonchi or rales.  Abdominal:     General: Abdomen is flat. Bowel sounds are normal. There is no distension.     Palpations: Abdomen is soft. There is no mass.     Tenderness: There is no abdominal tenderness. There is no guarding or rebound.     Hernia: No hernia is present.  Musculoskeletal:        General: Tenderness present. Normal range of motion.     Cervical back: Normal range of motion and neck supple.     Right lower leg: No edema.     Left lower leg: No edema.     Comments: L shoulder WNL R shoulder exam: No deformity of shoulders on inspection. Discomfort with palpation of posterior shoulder at bursa FROM in abduction and forward flexion. No pain or weakness with testing SITS in ext/int rotation. + pain with empty can sign. + Speed test. No impingement. No pain with rotation of humeral head in [facility] joint.   Lymphadenopathy:     Cervical: No cervical adenopathy.  Skin:    General: Skin is warm and dry.     Findings: No rash.  Neurological:     General: No focal deficit present.     Mental Status: She is alert and oriented to person, place, and time.     Comments: CN grossly intact, station and gait intact  Psychiatric:        Mood and Affect: Mood normal.        Behavior: Behavior normal.        Thought Content: Thought content normal.        Judgment: Judgment normal.       Results for orders placed or performed in visit on 12/03/19  POCT Urinalysis Dipstick (Automated)  Result  Value Ref Range   Color, UA yellow    Clarity, UA clear    Glucose, UA Negative Negative   Bilirubin, UA negative    Ketones, UA negative    Spec Grav, UA >=1.030 (A) 1.010 - 1.025   Blood, UA negative    pH, UA 6.0 5.0 - 8.0   Protein, UA Negative Negative   Urobilinogen, UA 0.2 0.2 or 1.0 E.U./dL   Nitrite, UA negative    Leukocytes, UA Negative Negative   Assessment & Plan:  This visit occurred during the SARS-CoV-2 public health emergency.  Safety protocols  were in place, including screening questions prior to the visit, additional usage of staff PPE, and extensive cleaning of exam room while observing appropriate contact time as indicated for disinfecting solutions.   Problem List Items Addressed This Visit    Vitamin D deficiency    Start 50k weekly dosing for 6 months then start 2000 IU daily.       Right shoulder pain    Anticipate RTC injury vs bursitis. Treat with exercises from [facility] pt advisor with resistance band. Update if not improving with treatment.       OSA on CPAP    Previously saw Dr Alva Garnet. Does not want to use CPAP at this time.      Morbid obesity with BMI of 50.0-59.9, adult ([place])    Encouraged healthy diet and lifestyle choices to affect sustainable weight loss. She plans to start regular exercise routine using home elliptical.       Encounter for general adult medical examination with abnormal findings - Primary    Preventative protocols reviewed and updated unless pt declined. Discussed healthy diet and lifestyle.       CKD (chronic kidney disease) stage 3, GFR 30-59 ml/min    Chronic. Normal microalbumin. Normal renal US 2017. Encouraged increasing water intake.       Anemia    Thought related to CKD. Iron % saturation mildly low, ferritin 25. Check iFOB and UA.       Relevant Orders   Fecal occult blood, imunochemical   POCT Urinalysis Dipstick (Automated) (Completed)   Amenorrhea    Concern for premature ovarian failure. Did not go to  [facility]. Encouraged GYN f/u.        Other Visit Diagnoses    Screen for STD (sexually transmitted disease)       Relevant Orders   HIV Antibody (routine testing w rflx)   RPR       Meds ordered this encounter  Medications  . Cholecalciferol (VITAMIN D3) 1.25 MG (50000 UT) TABS    Sig: Take 1 tablet by mouth once a week.    Dispense:  12 tablet    Refill:  1   Orders Placed This Encounter  Procedures  . Fecal occult blood, imunochemical    Standing Status:   Future    Standing Expiration Date:   12/02/2020  . HIV Antibody (routine testing w rflx)  . RPR  . POCT Urinalysis Dipstick (Automated)    Patient instructions: Call to schedule well woman exam.  You are doing well today. Return as needed or in 1-2 year for next physical.  Increase water intake.  Start vitamin D weekly replacement for 6 months then start 2000 units daily.  Kidneys remain mildly impaired but stable.  You are mildly anemic- check urinalysis today and pass by lab to pick up stool kit. Labs today.   Follow up plan: Return in about 2 years (around 12/02/2021) for annual exam, prior fasting for blood work.  Ria Bush, MD

## 2019-12-04 DIAGNOSIS — M25511 Pain in right shoulder: Secondary | ICD-10-CM | POA: Insufficient documentation

## 2019-12-04 LAB — HIV ANTIBODY (ROUTINE TESTING W REFLEX): HIV 1&2 Ab, 4th Generation: NONREACTIVE

## 2019-12-04 LAB — RPR: RPR Ser Ql: NONREACTIVE

## 2019-12-04 NOTE — Assessment & Plan Note (Signed)
Anticipate RTC injury vs bursitis. Treat with exercises from [facility] pt advisor with resistance band. Update if not improving with treatment.

## 2020-06-25 ENCOUNTER — Other Ambulatory Visit: Payer: Self-pay

## 2020-06-25 ENCOUNTER — Ambulatory Visit (INDEPENDENT_AMBULATORY_CARE_PROVIDER_SITE_OTHER): Payer: 59 | Admitting: Obstetrics & Gynecology

## 2020-06-25 ENCOUNTER — Other Ambulatory Visit (HOSPITAL_COMMUNITY)
Admission: RE | Admit: 2020-06-25 | Discharge: 2020-06-25 | Disposition: A | Discharge status: Home / Self Care | Payer: 59 | Source: Ambulatory Visit | Attending: Obstetrics & Gynecology | Admitting: Obstetrics & Gynecology

## 2020-06-25 ENCOUNTER — Encounter: Payer: Self-pay | Admitting: Obstetrics & Gynecology

## 2020-06-25 VITALS — BP 163/95 | HR 98 | Ht 64.0 in | Wt 303.0 lb

## 2020-06-25 DIAGNOSIS — Z1231 Encounter for screening mammogram for malignant neoplasm of breast: Secondary | ICD-10-CM

## 2020-06-25 DIAGNOSIS — E2839 Other primary ovarian failure: Secondary | ICD-10-CM

## 2020-06-25 DIAGNOSIS — Z01419 Encounter for gynecological examination (general) (routine) without abnormal findings: Secondary | ICD-10-CM | POA: Diagnosis not present

## 2020-06-25 HISTORY — DX: Other primary ovarian failure: E28.39

## 2020-06-25 NOTE — Progress Notes (Signed)
GYNECOLOGY ANNUAL PREVENTATIVE CARE ENCOUNTER NOTE  History:     Courtney Jones is a 39 y.o. [id number] female with history of premature ovarian failure/early menopause here for a routine annual gynecologic exam.  Current complaints: none.   Denies abnormal vaginal bleeding, discharge, pelvic pain, or other gynecologic concerns. Not currently sexually active.    Gynecologic History No LMP recorded. Patient is postmenopausal. Contraception: post menopausal status Last Pap: 2017. Results were: normal with negative HPV  Obstetric History OB History  Gravida Para Term Preterm AB Living  1       1    SAB IAB Ectopic Multiple Live Births    1          # Outcome Date GA Lbr Len/2nd Weight Sex Delivery Anes PTL Lv  1 IAB             Past Medical History:  Diagnosis Date  . Amenorrhea 04/25/2016   FSH was high.  Was set up to meet with [facility].  . CKD (chronic kidney disease) stage 3, GFR 30-59 ml/min (HCC) 04/04/2016   Normal renal US 04/2016 Normal microalb 2021  . History of chickenpox   . History of trichomonal vaginitis   . Infertility associated with anovulation    elevated [facility])  . Low grade squamous intraepithelial lesion (LGSIL) on Papanicolaou smear of cervix 2013   s/p colposcopy  . Morbid obesity with BMI of 50.0-59.9, adult (HCC)    Saw nutritionist 01/2016  . Obesity, Class III, BMI 40-49.9 (morbid obesity) (HCC)    saw nutritionist 01/2016  . OSA on CPAP 12/29/2015  . Premature ovarian failure 06/25/2020  . Vaginal Pap smear, abnormal   . Vitamin D deficiency     Past Surgical History:  Procedure Laterality Date  . INDUCED ABORTION  2002  . WISDOM TOOTH EXTRACTION  2016    Current Outpatient Medications on File Prior to Visit  Medication Sig Dispense Refill  . Cholecalciferol (VITAMIN D3) 1.25 MG (50000 UT) TABS Take 1 tablet by mouth once a week. 12 tablet 1   No current facility-administered medications on file prior to visit.    No Known  Allergies  Social History:  reports that she has never smoked. She has never used smokeless tobacco. She reports current alcohol use. She reports that she does not use drugs.  Family History  Problem Relation Age of Onset  . CAD Father 29       MI  . Breast cancer Maternal Grandmother 41  . Rheum arthritis Mother   . Stroke Neg Hx   . Diabetes Neg Hx     The following portions of the patient's history were reviewed and updated as appropriate: allergies, current medications, past family history, past medical history, past social history, past surgical history and problem list.  Review of Systems Pertinent items noted in HPI and remainder of comprehensive ROS otherwise negative.  Physical Exam:  BP (!) 163/95   Pulse 98   Ht 5\' 4"  (1.626 m)   Wt (!) 303 lb (137.4 kg)   BMI 52.01 kg/m  CONSTITUTIONAL: Well-developed, well-nourished female in no acute distress.  HENT:  Normocephalic, atraumatic, External right and left ear normal.  EYES: Conjunctivae and EOM are normal. Pupils are equal, round, and reactive to light. No scleral icterus.  NECK: Normal range of motion, supple, no masses.  Normal thyroid.  SKIN: Skin is warm and dry. No rash noted. Not diaphoretic. No erythema. No pallor. MUSCULOSKELETAL: Normal range  of motion. No tenderness.  No cyanosis, clubbing, or edema.   NEUROLOGIC: Alert and oriented to person, place, and time. Normal reflexes, muscle tone coordination. No cranial nerve deficit noted. PSYCHIATRIC: Normal mood and affect. Normal behavior. Normal judgment and thought content. CARDIOVASCULAR: Normal heart rate noted, regular rhythm RESPIRATORY: Clear to auscultation bilaterally. Effort and breath sounds normal, no problems with respiration noted. BREASTS: Symmetric in size. No masses, tenderness, skin changes, nipple drainage, or lymphadenopathy bilaterally.  Performed in the presence of a chaperone. ABDOMEN: Soft, obese, no distention appreciated.  No tenderness,  rebound or guarding.  PELVIC: Normal appearing external genitalia and urethral meatus; normal appearing vaginal mucosa and cervix.  Normal appearing discharge.  Pap smear obtained.  Unable to palpate uterus or adnexa secondary to habitus.  Performed in the presence of a chaperone.   Assessment and Plan:      1. Breast cancer screening by mammogram Mammogram scheduled - MM 3D SCREEN BREAST BILATERAL; Future  2. Well woman exam with routine gynecological exam - Cytology - PAP Will follow up results of pap smear and manage accordingly. Routine preventative health maintenance measures emphasized. Please refer to After Visit Summary for other counseling recommendations.      Jaynie Collins, MD, FACOG Obstetrician & Gynecologist, [facility] for [employer], [facility]

## 2020-06-25 NOTE — Patient Instructions (Signed)

## 2020-07-01 LAB — CYTOLOGY - PAP
Comment: NEGATIVE
Diagnosis: NEGATIVE
High risk HPV: NEGATIVE

## 2020-08-04 ENCOUNTER — Ambulatory Visit
Admission: RE | Admit: 2020-08-04 | Discharge: 2020-08-04 | Disposition: A | Discharge status: Home / Self Care | Payer: 59 | Source: Ambulatory Visit | Attending: Obstetrics & Gynecology | Admitting: Obstetrics & Gynecology

## 2020-08-04 ENCOUNTER — Other Ambulatory Visit: Payer: Self-pay

## 2020-08-04 DIAGNOSIS — Z1231 Encounter for screening mammogram for malignant neoplasm of breast: Secondary | ICD-10-CM

## 2020-08-04 HISTORY — PX: BREAST BIOPSY: SHX20

## 2020-08-05 ENCOUNTER — Other Ambulatory Visit: Payer: Self-pay | Admitting: Obstetrics & Gynecology

## 2020-08-05 DIAGNOSIS — R928 Other abnormal and inconclusive findings on diagnostic imaging of breast: Secondary | ICD-10-CM

## 2020-08-19 ENCOUNTER — Other Ambulatory Visit: Payer: Self-pay | Admitting: Obstetrics & Gynecology

## 2020-08-19 ENCOUNTER — Other Ambulatory Visit: Payer: Self-pay

## 2020-08-19 ENCOUNTER — Ambulatory Visit
Admission: RE | Admit: 2020-08-19 | Discharge: 2020-08-19 | Disposition: A | Discharge status: Home / Self Care | Payer: 59 | Source: Ambulatory Visit | Attending: Obstetrics & Gynecology | Admitting: Obstetrics & Gynecology

## 2020-08-19 DIAGNOSIS — N6310 Unspecified lump in the right breast, unspecified quadrant: Secondary | ICD-10-CM

## 2020-08-19 DIAGNOSIS — R928 Other abnormal and inconclusive findings on diagnostic imaging of breast: Secondary | ICD-10-CM

## 2020-08-21 ENCOUNTER — Other Ambulatory Visit: Payer: Self-pay

## 2020-08-21 ENCOUNTER — Ambulatory Visit
Admission: RE | Admit: 2020-08-21 | Discharge: 2020-08-21 | Disposition: A | Discharge status: Home / Self Care | Payer: 59 | Source: Ambulatory Visit | Attending: Obstetrics & Gynecology | Admitting: Obstetrics & Gynecology

## 2020-08-21 ENCOUNTER — Other Ambulatory Visit: Payer: Self-pay | Admitting: Diagnostic Radiology

## 2020-08-21 DIAGNOSIS — N6310 Unspecified lump in the right breast, unspecified quadrant: Secondary | ICD-10-CM

## 2020-08-24 ENCOUNTER — Encounter: Payer: Self-pay | Admitting: Family Medicine

## 2020-11-26 ENCOUNTER — Other Ambulatory Visit: Payer: Self-pay | Admitting: Family Medicine

## 2020-11-26 DIAGNOSIS — E559 Vitamin D deficiency, unspecified: Secondary | ICD-10-CM

## 2020-11-26 DIAGNOSIS — N1831 Chronic kidney disease, stage 3a: Secondary | ICD-10-CM

## 2020-11-26 DIAGNOSIS — E611 Iron deficiency: Secondary | ICD-10-CM

## 2020-11-26 DIAGNOSIS — N183 Chronic kidney disease, stage 3 unspecified: Secondary | ICD-10-CM

## 2020-11-27 ENCOUNTER — Other Ambulatory Visit (INDEPENDENT_AMBULATORY_CARE_PROVIDER_SITE_OTHER): Payer: 59

## 2020-11-27 ENCOUNTER — Other Ambulatory Visit: Payer: Self-pay

## 2020-11-27 DIAGNOSIS — D631 Anemia in chronic kidney disease: Secondary | ICD-10-CM

## 2020-11-27 DIAGNOSIS — E611 Iron deficiency: Secondary | ICD-10-CM

## 2020-11-27 DIAGNOSIS — E559 Vitamin D deficiency, unspecified: Secondary | ICD-10-CM

## 2020-11-27 DIAGNOSIS — N183 Chronic kidney disease, stage 3 unspecified: Secondary | ICD-10-CM

## 2020-11-27 DIAGNOSIS — N1831 Chronic kidney disease, stage 3a: Secondary | ICD-10-CM | POA: Diagnosis not present

## 2020-11-27 LAB — CBC WITH DIFFERENTIAL/PLATELET
Basophils Absolute: 0 10*3/uL (ref 0.0–0.1)
Basophils Relative: 0.5 % (ref 0.0–3.0)
Eosinophils Absolute: 0.1 10*3/uL (ref 0.0–0.7)
Eosinophils Relative: 1.1 % (ref 0.0–5.0)
HCT: 36.5 % (ref 36.0–46.0)
Hemoglobin: 12.1 g/dL (ref 12.0–15.0)
Lymphocytes Relative: 43.1 % (ref 12.0–46.0)
Lymphs Abs: 3 10*3/uL (ref 0.7–4.0)
MCHC: 33.1 g/dL (ref 30.0–36.0)
MCV: 80.8 fl (ref 78.0–100.0)
Monocytes Absolute: 0.3 10*3/uL (ref 0.1–1.0)
Monocytes Relative: 4.8 % (ref 3.0–12.0)
Neutro Abs: 3.5 10*3/uL (ref 1.4–7.7)
Neutrophils Relative %: 50.5 % (ref 43.0–77.0)
Platelets: 295 10*3/uL (ref 150.0–400.0)
RBC: 4.52 Mil/uL (ref 3.87–5.11)
RDW: 17.7 % — ABNORMAL HIGH (ref 11.5–15.5)
WBC: 6.9 10*3/uL (ref 4.0–10.5)

## 2020-11-27 LAB — COMPREHENSIVE METABOLIC PANEL
ALT: 8 U/L (ref 0–35)
AST: 12 U/L (ref 0–37)
Albumin: 3.8 g/dL (ref 3.5–5.2)
Alkaline Phosphatase: 100 U/L (ref 39–117)
BUN: 13 mg/dL (ref 6–23)
CO2: 29 mEq/L (ref 19–32)
Calcium: 9.4 mg/dL (ref 8.4–10.5)
Chloride: 105 mEq/L (ref 96–112)
Creatinine, Ser: 1.16 mg/dL (ref 0.40–1.20)
GFR: 58.68 mL/min — ABNORMAL LOW (ref >60.00)
Glucose, Bld: 93 mg/dL (ref 70–99)
Potassium: 4.3 mEq/L (ref 3.5–5.1)
Sodium: 142 mEq/L (ref 135–145)
Total Bilirubin: 0.6 mg/dL (ref 0.2–1.2)
Total Protein: 7.1 g/dL (ref 6.0–8.3)

## 2020-11-27 LAB — MICROALBUMIN / CREATININE URINE RATIO
Creatinine,U: 146.9 mg/dL
Microalb Creat Ratio: 0.5 mg/g (ref 0.0–30.0)
Microalb, Ur: <0.7 mg/dL (ref 0.0–1.9)

## 2020-11-27 LAB — LIPID PANEL
Cholesterol: 200 mg/dL (ref 0–200)
HDL: 51.2 mg/dL (ref >39.00)
LDL Cholesterol: 139 mg/dL — ABNORMAL HIGH (ref 0–99)
NonHDL: 149.24
Total CHOL/HDL Ratio: 4
Triglycerides: 50 mg/dL (ref 0.0–149.0)
VLDL: 10 mg/dL (ref 0.0–40.0)

## 2020-11-27 LAB — IBC PANEL
Iron: 37 ug/dL — ABNORMAL LOW (ref 42–145)
Saturation Ratios: 9.8 % — ABNORMAL LOW (ref 20.0–50.0)
Transferrin: 271 mg/dL (ref 212.0–360.0)

## 2020-11-27 LAB — VITAMIN D 25 HYDROXY (VIT D DEFICIENCY, FRACTURES): VITD: 28.79 ng/mL — ABNORMAL LOW (ref 30.00–100.00)

## 2020-11-27 LAB — FERRITIN: Ferritin: 47.8 ng/mL (ref 10.0–291.0)

## 2020-12-04 ENCOUNTER — Ambulatory Visit (INDEPENDENT_AMBULATORY_CARE_PROVIDER_SITE_OTHER): Payer: 59 | Admitting: Family Medicine

## 2020-12-04 ENCOUNTER — Other Ambulatory Visit: Payer: Self-pay

## 2020-12-04 ENCOUNTER — Encounter: Payer: Self-pay | Admitting: Family Medicine

## 2020-12-04 DIAGNOSIS — G4733 Obstructive sleep apnea (adult) (pediatric): Secondary | ICD-10-CM

## 2020-12-04 DIAGNOSIS — E559 Vitamin D deficiency, unspecified: Secondary | ICD-10-CM | POA: Diagnosis not present

## 2020-12-04 DIAGNOSIS — Z9989 Dependence on other enabling machines and devices: Secondary | ICD-10-CM

## 2020-12-04 DIAGNOSIS — Z6841 Body Mass Index (BMI) 40.0 and over, adult: Secondary | ICD-10-CM

## 2020-12-04 DIAGNOSIS — Z0000 Encounter for general adult medical examination without abnormal findings: Secondary | ICD-10-CM | POA: Diagnosis not present

## 2020-12-04 DIAGNOSIS — N1831 Chronic kidney disease, stage 3a: Secondary | ICD-10-CM

## 2020-12-04 DIAGNOSIS — E2839 Other primary ovarian failure: Secondary | ICD-10-CM

## 2020-12-04 DIAGNOSIS — E611 Iron deficiency: Secondary | ICD-10-CM

## 2020-12-04 NOTE — Progress Notes (Signed)
Patient ID: Courtney Jones, female    DOB: 1981/05/07, 40 y.o.   MRN: [id number]  This visit was conducted in person.  BP 122/78   Pulse 92   Temp 97.6 F (36.4 C) (Temporal)   Ht 5\' 4"  (1.626 m)   Wt (!) 301 lb 8 oz (136.8 kg)   SpO2 98%   BMI 51.75 kg/m    CC: CPE  Subjective:   HPI: Courtney Jones is a 40 y.o. female presenting on 12/04/2020 for Annual Exam   Mild OSA previously on CPAP. Has not been using CPAP, considering restarting. No significant daytime sleepiness.   Preventative: Well womanDr [facility]an[facility]- normal paps 2017, 2021, h/o HPV remotely. Last well woman was 06/2020, normal mammogram  Mammogram - abnormal 08/2020 s/p benign biopsy (fibroadenomatuos changes) LMP years ago - ?premature menopause - may benefit from hormonal check  Flu shotyearly at work COVID vaccines 09/2020 08/2019, 09/2019, booster x1 05/2020 Tdap 02/2010  Seat belt use discussed Sunscreen use discussed.No changing moles on skin.  Non smoker Alcohol - rare Dentist q6 mo  Eye exam yearly   Marital status: single Edu: college 03/2010)  Occupation: Chemical engineer Activity: started walking track at park every day  Diet: good water, fruits/vegetables daily     Relevant past medical, surgical, family and social history reviewed and updated as indicated. Interim medical history since our last visit reviewed. Allergies and medications reviewed and updated. Outpatient Medications Prior to Visit  Medication Sig Dispense Refill  . Cholecalciferol (VITAMIN D3) 125 MCG (5000 UT) CAPS Take 2 capsules by mouth daily.    . Ferrous Sulfate (IRON) 325 (65 Fe) MG TABS Take 1 tablet by mouth daily.     No facility-administered medications prior to visit.     Per HPI unless specifically indicated in ROS section below Review of Systems  Constitutional: Negative for activity change, appetite change, chills, fatigue, fever and unexpected weight change.  HENT: Negative for  hearing loss.   Eyes: Negative for visual disturbance.  Respiratory: Positive for cough (lingering after recent cold). Negative for chest tightness, shortness of breath and wheezing.   Cardiovascular: Negative for chest pain, palpitations and leg swelling.  Gastrointestinal: Negative for abdominal distention, abdominal pain, blood in stool, constipation, diarrhea, nausea and vomiting.  Genitourinary: Negative for difficulty urinating and hematuria.  Musculoskeletal: Negative for arthralgias, myalgias and neck pain.  Skin: Negative for rash.  Neurological: Negative for dizziness, seizures, syncope and headaches.  Hematological: Negative for adenopathy. Does not bruise/bleed easily.  Psychiatric/Behavioral: Negative for dysphoric mood. The patient is not nervous/anxious.    Objective:  BP 122/78   Pulse 92   Temp 97.6 F (36.4 C) (Temporal)   Ht 5\' 4"  (1.626 m)   Wt (!) 301 lb 8 oz (136.8 kg)   SpO2 98%   BMI 51.75 kg/m   Wt Readings from Last 3 Encounters:  12/04/20 (!) 301 lb 8 oz (136.8 kg)  06/25/20 (!) 303 lb (137.4 kg)  12/03/19 294 lb 1 oz (133.4 kg)      Physical Exam Vitals and nursing note reviewed.  Constitutional:      General: She is not in acute distress.    Appearance: Normal appearance. She is well-developed. She is not ill-appearing.  HENT:     Head: Normocephalic and atraumatic.     Right Ear: Hearing, tympanic membrane, ear canal and external ear normal.     Left Ear: Hearing, tympanic membrane, ear canal and  external ear normal.  Eyes:     General: No scleral icterus.    Extraocular Movements: Extraocular movements intact.     Conjunctiva/sclera: Conjunctivae normal.     Pupils: Pupils are equal, round, and reactive to light.  Neck:     Thyroid: No thyroid mass or thyromegaly.  Cardiovascular:     Rate and Rhythm: Normal rate and regular rhythm.     Pulses: Normal pulses.          Radial pulses are 2+ on the right side and 2+ on the left side.      Heart sounds: Normal heart sounds. No murmur heard.   Pulmonary:     Effort: Pulmonary effort is normal. No respiratory distress.     Breath sounds: Normal breath sounds. No wheezing, rhonchi or rales.  Abdominal:     General: Bowel sounds are normal. There is no distension.     Palpations: Abdomen is soft. There is no mass.     Tenderness: There is no abdominal tenderness. There is no guarding or rebound.     Hernia: No hernia is present.  Musculoskeletal:        General: Normal range of motion.     Cervical back: Normal range of motion and neck supple.     Right lower leg: No edema.     Left lower leg: No edema.  Lymphadenopathy:     Cervical: No cervical adenopathy.  Skin:    General: Skin is warm and dry.     Findings: No rash.  Neurological:     General: No focal deficit present.     Mental Status: She is alert and oriented to person, place, and time.     Comments: CN grossly intact, station and gait intact  Psychiatric:        Mood and Affect: Mood normal.        Behavior: Behavior normal.        Thought Content: Thought content normal.        Judgment: Judgment normal.       Results for orders placed or performed in visit on 11/27/20  Microalbumin / creatinine urine ratio  Result Value Ref Range   Microalb, Ur <0.7 0.0 - 1.9 mg/dL   Creatinine,U 562.1 mg/dL   Microalb Creat Ratio 0.5 0.0 - 30.0 mg/g  IBC panel  Result Value Ref Range   Iron 37 (L) 42 - 145 ug/dL   Transferrin 308.6 578.4 - 360.0 mg/dL   Saturation Ratios 9.8 (L) 20.0 - 50.0 %  Ferritin  Result Value Ref Range   Ferritin 47.8 10.0 - 291.0 ng/mL  VITAMIN D 25 Hydroxy (Vit-D Deficiency, Fractures)  Result Value Ref Range   VITD 28.79 (L) 30.00 - 100.00 ng/mL  Comprehensive metabolic panel  Result Value Ref Range   Sodium 142 135 - 145 mEq/L   Potassium 4.3 3.5 - 5.1 mEq/L   Chloride 105 96 - 112 mEq/L   CO2 29 19 - 32 mEq/L   Glucose, Bld 93 70 - 99 mg/dL   BUN 13 6 - 23 mg/dL    Creatinine, Ser 6.96 0.40 - 1.20 mg/dL   Total Bilirubin 0.6 0.2 - 1.2 mg/dL   Alkaline Phosphatase 100 39 - 117 U/L   AST 12 0 - 37 U/L   ALT 8 0 - 35 U/L   Total Protein 7.1 6.0 - 8.3 g/dL   Albumin 3.8 3.5 - 5.2 g/dL   GFR 29.52 (L) >84.13 mL/min  Calcium 9.4 8.4 - 10.5 mg/dL  Lipid panel  Result Value Ref Range   Cholesterol 200 0 - 200 mg/dL   Triglycerides 28.3 0.0 - 149.0 mg/dL   HDL 15.17 >61.60 mg/dL   VLDL 73.7 0.0 - 10.6 mg/dL   LDL Cholesterol 269 (H) 0 - 99 mg/dL   Total CHOL/HDL Ratio 4    NonHDL 149.24   CBC with Differential/Platelet  Result Value Ref Range   WBC 6.9 4.0 - 10.5 K/uL   RBC 4.52 3.87 - 5.11 Mil/uL   Hemoglobin 12.1 12.0 - 15.0 g/dL   HCT 48.5 46.2 - 70.3 %   MCV 80.8 78.0 - 100.0 fl   MCHC 33.1 30.0 - 36.0 g/dL   RDW 50.0 (H) 93.8 - 18.2 %   Platelets 295.0 150.0 - 400.0 K/uL   Neutrophils Relative % 50.5 43.0 - 77.0 %   Lymphocytes Relative 43.1 12.0 - 46.0 %   Monocytes Relative 4.8 3.0 - 12.0 %   Eosinophils Relative 1.1 0.0 - 5.0 %   Basophils Relative 0.5 0.0 - 3.0 %   Neutro Abs 3.5 1.4 - 7.7 K/uL   Lymphs Abs 3.0 0.7 - 4.0 K/uL   Monocytes Absolute 0.3 0.1 - 1.0 K/uL   Eosinophils Absolute 0.1 0.0 - 0.7 K/uL   Basophils Absolute 0.0 0.0 - 0.1 K/uL   Assessment & Plan:  This visit occurred during the SARS-CoV-2 public health emergency.  Safety protocols were in place, including screening questions prior to the visit, additional usage of staff PPE, and extensive cleaning of exam room while observing appropriate contact time as indicated for disinfecting solutions.   Problem List Items Addressed This Visit    Morbid obesity with BMI of 50.0-59.9, adult (HCC)    Encouraged healthy diet and lifestyle changes to affect sustainable weight loss. She asks about bariatric surgery info - website for [facility] provided.      Health maintenance examination    Preventative protocols reviewed and updated unless pt  declined. Discussed healthy diet and lifestyle.       OSA on CPAP    Considering restarting CPAP      Vitamin D deficiency    Continues high dose 10,000 IU daily, levels remain borderline low.       CKD (chronic kidney disease) stage 3, GFR 30-59 ml/min (HCC)    Microalb normal.  Continue to encouraged good hydration status.       Iron deficiency    Continue oral iron. Iron rich food handout provided today.       Premature ovarian failure    ?of this. Consider hormonal evaluation next labwork.           No orders of the defined types were placed in this encounter.  No orders of the defined types were placed in this encounter.   Patient instructions: Good to see you today  Work on iron rich diet. Work on daily walking routine Continue good water intake daily.  May look at [email or web address] for more information on bariatric surgery, or call [phone] to sign up for bariatric seminar.  Good to see you today, return in 1 year for next physical.   Follow up plan: Return in about 1 year (around 12/04/2021) for annual exam, prior fasting for blood work.  Eustaquio Boyden, MD

## 2020-12-04 NOTE — Patient Instructions (Addendum)
Good to see you today  Work on iron rich diet. Work on daily walking routine Continue good water intake daily.  May look at [email or web address] for more information on bariatric surgery, or call [phone] to sign up for bariatric seminar.  Good to see you today, return in 1 year for next physical.   Health Maintenance, Female Adopting a healthy lifestyle and getting preventive care are important in promoting health and wellness. Ask your health care provider about:  The right schedule for you to have regular tests and exams.  Things you can do on your own to prevent diseases and keep yourself healthy. What should I know about diet, weight, and exercise? Eat a healthy diet  Eat a diet that includes plenty of vegetables, fruits, low-fat dairy products, and lean protein.  Do not eat a lot of foods that are high in solid fats, added sugars, or sodium.   Maintain a healthy weight Body mass index (BMI) is used to identify weight problems. It estimates body fat based on height and weight. Your health care provider can help determine your BMI and help you achieve or maintain a healthy weight. Get regular exercise Get regular exercise. This is one of the most important things you can do for your health. Most adults should:  Exercise for at least 150 minutes each week. The exercise should increase your heart rate and make you sweat (moderate-intensity exercise).  Do strengthening exercises at least twice a week. This is in addition to the moderate-intensity exercise.  Spend less time sitting. Even light physical activity can be beneficial. Watch cholesterol and blood lipids Have your blood tested for lipids and cholesterol at 41 years of age, then have this test every 5 years. Have your cholesterol levels checked more often if:  Your lipid or cholesterol levels are high.  You are older than 41 years of age.  You are at high risk for heart disease. What  should I know about cancer screening? Depending on your health history and family history, you may need to have cancer screening at various ages. This may include screening for:  Breast cancer.  Cervical cancer.  Colorectal cancer.  Skin cancer.  Lung cancer. What should I know about heart disease, diabetes, and high blood pressure? Blood pressure and heart disease  High blood pressure causes heart disease and increases the risk of stroke. This is more likely to develop in people who have high blood pressure readings, are of African descent, or are overweight.  Have your blood pressure checked: ? Every 3-5 years if you are 39-54 years of age. ? Every year if you are 48 years old or older. Diabetes Have regular diabetes screenings. This checks your fasting blood sugar level. Have the screening done:  Once every three years after age 59 if you are at a normal weight and have a low risk for diabetes.  More often and at a younger age if you are overweight or have a high risk for diabetes. What should I know about preventing infection? Hepatitis B If you have a higher risk for hepatitis B, you should be screened for this virus. Talk with your health care provider to find out if you are at risk for hepatitis B infection. Hepatitis C Testing is recommended for:  Everyone born from [birth year] through 1966.  Anyone with known risk factors for hepatitis C. Sexually transmitted infections (STIs)  Get screened for STIs, including gonorrhea and chlamydia, if: ? You are sexually active and are younger  than 41 years of age. ? You are older than 41 years of age and your health care provider tells you that you are at risk for this type of infection. ? Your sexual activity has changed since you were last screened, and you are at increased risk for chlamydia or gonorrhea. Ask your health care provider if you are at risk.  Ask your health care provider about whether you are at high risk for HIV. Your  health care provider may recommend a prescription medicine to help prevent HIV infection. If you choose to take medicine to prevent HIV, you should first get tested for HIV. You should then be tested every 3 months for as long as you are taking the medicine. Pregnancy  If you are about to stop having your period (premenopausal) and you may become pregnant, seek counseling before you get pregnant.  Take 400 to 800 micrograms (mcg) of folic acid every day if you become pregnant.  Ask for birth control (contraception) if you want to prevent pregnancy. Osteoporosis and menopause Osteoporosis is a disease in which the bones lose minerals and strength with aging. This can result in bone fractures. If you are 14 years old or older, or if you are at risk for osteoporosis and fractures, ask your health care provider if you should:  Be screened for bone loss.  Take a calcium or vitamin D supplement to lower your risk of fractures.  Be given hormone replacement therapy (HRT) to treat symptoms of menopause. Follow these instructions at home: Lifestyle  Do not use any products that contain nicotine or tobacco, such as cigarettes, e-cigarettes, and chewing tobacco. If you need help quitting, ask your health care provider.  Do not use street drugs.  Do not share needles.  Ask your health care provider for help if you need support or information about quitting drugs. Alcohol use  Do not drink alcohol if: ? Your health care provider tells you not to drink. ? You are pregnant, may be pregnant, or are planning to become pregnant.  If you drink alcohol: ? Limit how much you use to 0-1 drink a day. ? Limit intake if you are breastfeeding.  Be aware of how much alcohol is in your drink. In the [place] one drink equals one 12 oz bottle of beer (355 mL), one 5 oz glass of wine (148 mL), or one 1 oz glass of hard liquor (44 mL). General instructions  Schedule regular health, dental, and eye  exams.  Stay current with your vaccines.  Tell your health care provider if: ? You often feel depressed. ? You have ever been abused or do not feel safe at home. Summary  Adopting a healthy lifestyle and getting preventive care are important in promoting health and wellness.  Follow your health care provider's instructions about healthy diet, exercising, and getting tested or screened for diseases.  Follow your health care provider's instructions on monitoring your cholesterol and blood pressure. This information is not intended to replace advice given to you by your health care provider. Make sure you discuss any questions you have with your health care provider. Document Revised: 06/13/2018 Document Reviewed: 06/13/2018 Elsevier Patient Education  2021 [employer].

## 2020-12-04 NOTE — Assessment & Plan Note (Addendum)
Encouraged healthy diet and lifestyle changes to affect sustainable weight loss. She asks about bariatric surgery info - website for [facility] provided.

## 2020-12-04 NOTE — Assessment & Plan Note (Addendum)
?  of this. Consider hormonal evaluation next labwork.

## 2020-12-04 NOTE — Assessment & Plan Note (Signed)
Preventative protocols reviewed and updated unless pt declined. Discussed healthy diet and lifestyle.  

## 2020-12-04 NOTE — Assessment & Plan Note (Signed)
Microalb normal.  Continue to encouraged good hydration status.

## 2020-12-04 NOTE — Assessment & Plan Note (Signed)
Considering restarting CPAP

## 2020-12-04 NOTE — Assessment & Plan Note (Signed)
Continue oral iron. Iron rich food handout provided today.

## 2020-12-04 NOTE — Assessment & Plan Note (Signed)
Continues high dose 10,000 IU daily, levels remain borderline low.

## 2021-05-13 ENCOUNTER — Encounter: Payer: Self-pay | Admitting: Radiology

## 2021-09-30 ENCOUNTER — Telehealth: Payer: Self-pay

## 2021-09-30 NOTE — Telephone Encounter (Signed)
Left vm for pt to call the office back to get scheduled for annual visit with Dr. Harolyn Rutherford.  ?

## 2021-10-01 DIAGNOSIS — Z1231 Encounter for screening mammogram for malignant neoplasm of breast: Secondary | ICD-10-CM

## 2021-10-26 ENCOUNTER — Encounter: Payer: Self-pay | Admitting: Obstetrics & Gynecology

## 2021-10-26 ENCOUNTER — Ambulatory Visit (INDEPENDENT_AMBULATORY_CARE_PROVIDER_SITE_OTHER): Payer: BC Managed Care – PPO | Admitting: Obstetrics & Gynecology

## 2021-10-26 VITALS — BP 135/84 | HR 96 | Ht 64.0 in | Wt 296.0 lb

## 2021-10-26 DIAGNOSIS — Z1231 Encounter for screening mammogram for malignant neoplasm of breast: Secondary | ICD-10-CM

## 2021-10-26 DIAGNOSIS — Z01419 Encounter for gynecological examination (general) (routine) without abnormal findings: Secondary | ICD-10-CM

## 2021-10-26 NOTE — Progress Notes (Signed)
? ? ?GYNECOLOGY ANNUAL PREVENTATIVE CARE ENCOUNTER NOTE ? ?History:    ? Courtney Jones is a 41 y.o. [id number] female here for a routine annual gynecologic exam.  Current complaints: none.   Denies abnormal vaginal bleeding, discharge, pelvic pain, problems with intercourse or other gynecologic concerns.  ?  ?Gynecologic History ?No LMP recorded. Patient is postmenopausal. ?Last Pap: 06/25/20. Result was normal with negative HPV. ?Last Mammogram: 08/2020.  Result was abnormal, but had benign biopsy ? ?Obstetric History ?OB History  ?Gravida Para Term Preterm AB Living  ?1       1    ?SAB IAB Ectopic Multiple Live Births  ?  1        ?  ?# Outcome Date GA Lbr Len/2nd Weight Sex Delivery Anes PTL Lv  ?1 IAB           ? ? ?Past Medical History:  ?Diagnosis Date  ? Amenorrhea 04/25/2016  ? FSH was high.  Was set up to meet with [facility].  ? CKD (chronic kidney disease) stage 3, GFR 30-59 ml/min (HCC) 04/04/2016  ? Normal renal US 04/2016 Normal microalb 2021  ? History of chickenpox   ? History of trichomonal vaginitis   ? Infertility associated with anovulation   ? elevated FSH [facility])  ? Low grade squamous intraepithelial lesion (LGSIL) on Papanicolaou smear of cervix 2013  ? s/p colposcopy  ? Morbid obesity with BMI of 50.0-59.9, adult ([place])   ? Saw nutritionist 01/2016  ? Obesity, Class III, BMI 40-49.9 (morbid obesity) ([place])   ? saw nutritionist 01/2016  ? OSA on CPAP 12/29/2015  ? Premature ovarian failure 06/25/2020  ? Vaginal Pap smear, abnormal   ? Vitamin D deficiency   ? ? ?Past Surgical History:  ?Procedure Laterality Date  ? BREAST BIOPSY Right 08/2020  ? benign fibroadenomatous change  ? INDUCED ABORTION  2002  ? [place] EXTRACTION  2016  ? ? ?Current Outpatient Medications on File Prior to Visit  ?Medication Sig Dispense Refill  ? Cholecalciferol (VITAMIN D3) 125 MCG (5000 UT) CAPS Take 2 capsules by mouth daily.    ? Ferrous Sulfate (IRON) 325 (65 Fe) MG TABS Take 1 tablet by mouth daily.    ? ?No current  facility-administered medications on file prior to visit.  ? ? ?No Known Allergies ? ?Social History:  reports that she has never smoked. She has never used smokeless tobacco. She reports current alcohol use. She reports that she does not use drugs. ? ?Family History  ?Problem Relation Age of Onset  ? CAD Father 58  ?     MI  ? Breast cancer Maternal Grandmother 69  ? Rheum arthritis Mother   ? Stroke Neg Hx   ? Diabetes Neg Hx   ? ? ?The following portions of the patient's history were reviewed and updated as appropriate: allergies, current medications, past family history, past medical history, past social history, past surgical history and problem list. ? ?Review of Systems ?Pertinent items noted in HPI and remainder of comprehensive ROS otherwise negative. ? ?Physical Exam:  ?BP 135/84   Pulse 96   Ht 5\' 4"  (1.626 m)   Wt 296 lb (134.3 kg)   BMI 50.81 kg/m?  ?CONSTITUTIONAL: Well-developed, well-nourished female in no acute distress.  ?HENT:  Normocephalic, atraumatic, External right and left ear normal.  ?EYES: Conjunctivae and EOM are normal. Pupils are equal, round, and reactive to light. No scleral icterus.  ?NECK: Normal range of motion, supple, no  masses.  Normal thyroid.  ?SKIN: Skin is warm and dry. No rash noted. Not diaphoretic. No erythema. No pallor. ?MUSCULOSKELETAL: Normal range of motion. No tenderness.  No cyanosis, clubbing, or edema. ?NEUROLOGIC: Alert and oriented to person, place, and time. Normal reflexes, muscle tone coordination.  ?PSYCHIATRIC: Normal mood and affect. Normal behavior. Normal judgment and thought content. ?CARDIOVASCULAR: Normal heart rate noted, regular rhythm ?RESPIRATORY: Clear to auscultation bilaterally. Effort and breath sounds normal, no problems with respiration noted. ?BREASTS: Large, symmetric in size. No masses, tenderness, skin changes, nipple drainage, or lymphadenopathy bilaterally. Performed in the presence of a chaperone. ?ABDOMEN: Soft, obese, no  distention noted.  No tenderness, rebound or guarding.  ?PELVIC: Normal appearing external genitalia and urethral meatus; normal appearing distal vaginal mucosa.  No abnormal vaginal discharge noted. Speculum and bimanual exams deferred. Performed in the presence of a chaperone. ?  ?Assessment and Plan:  ?   ?1. Breast cancer screening by mammogram ?Mammogram scheduled ?- MM 3D SCREEN BREAST BILATERAL; Future ? ?2. Well woman exam with routine gynecological exam ?Pap smear is up to date ?Routine preventative health maintenance measures emphasized. ?Please refer to After Visit Summary for other counseling recommendations.  ?   ? ?Verita Schneiders, MD, FACOG ?Obstetrician Social research officer, government, Faculty Practice ?[facility] ? ?

## 2021-10-27 ENCOUNTER — Ambulatory Visit
Admission: RE | Admit: 2021-10-27 | Discharge: 2021-10-27 | Disposition: A | Discharge status: Home / Self Care | Payer: BC Managed Care – PPO | Source: Ambulatory Visit | Attending: Obstetrics & Gynecology | Admitting: Obstetrics & Gynecology

## 2021-10-27 DIAGNOSIS — Z1231 Encounter for screening mammogram for malignant neoplasm of breast: Secondary | ICD-10-CM

## 2021-10-28 ENCOUNTER — Other Ambulatory Visit: Payer: Self-pay | Admitting: Obstetrics & Gynecology

## 2021-10-28 ENCOUNTER — Encounter: Payer: Self-pay | Admitting: Family Medicine

## 2021-10-28 DIAGNOSIS — R928 Other abnormal and inconclusive findings on diagnostic imaging of breast: Secondary | ICD-10-CM

## 2021-11-06 ENCOUNTER — Other Ambulatory Visit: Payer: Self-pay | Admitting: Obstetrics & Gynecology

## 2021-11-06 ENCOUNTER — Ambulatory Visit
Admission: RE | Admit: 2021-11-06 | Discharge: 2021-11-06 | Disposition: A | Discharge status: Home / Self Care | Payer: BC Managed Care – PPO | Source: Ambulatory Visit | Attending: Obstetrics & Gynecology | Admitting: Obstetrics & Gynecology

## 2021-11-06 DIAGNOSIS — N6310 Unspecified lump in the right breast, unspecified quadrant: Secondary | ICD-10-CM

## 2021-11-06 DIAGNOSIS — R928 Other abnormal and inconclusive findings on diagnostic imaging of breast: Secondary | ICD-10-CM

## 2021-11-11 ENCOUNTER — Other Ambulatory Visit: Payer: BC Managed Care – PPO

## 2021-11-12 ENCOUNTER — Encounter: Payer: Self-pay | Admitting: Family Medicine

## 2021-11-12 ENCOUNTER — Ambulatory Visit: Payer: BC Managed Care – PPO | Admitting: Family Medicine

## 2021-11-12 DIAGNOSIS — Z6841 Body Mass Index (BMI) 40.0 and over, adult: Secondary | ICD-10-CM | POA: Diagnosis not present

## 2021-11-12 MED ORDER — WEGOVY 0.5 MG/0.5ML ~~LOC~~ SOAJ: 0.5000 mg | SUBCUTANEOUS | 0 refills | Status: DC

## 2021-11-12 MED ORDER — WEGOVY 0.25 MG/0.5ML ~~LOC~~ SOAJ: 0.2500 mg | SUBCUTANEOUS | 0 refills | Status: DC

## 2021-11-12 NOTE — Patient Instructions (Addendum)
Price out [employer] sent to pharmacy.  ?Start at 0.25mg  weekly for 1 month and if tolerated we will continue titration schedule.  ?Work on exercise routine.  ?Return in 4-6 weeks for physical.  ? ?Semaglutide Injection (Weight Management) ?What is this medication? ?SEMAGLUTIDE (SEM a GLOO tide) promotes weight loss. It may also be used to maintain weight loss. It works by decreasing appetite. Changes to diet and exercise are often combined with this medication. ?This medicine may be used for other purposes; ask your health care provider or pharmacist if you have questions. ?COMMON BRAND NAME(S): Wegovy ?What should I tell my care team before I take this medication? ?They need to know if you have any of these conditions: ?Endocrine tumors (MEN 2) or if someone in your family had these tumors ?Eye disease, vision problems ?Gallbladder disease ?History of depression or mental health disease ?History of pancreatitis ?Kidney disease ?Stomach or intestine problems ?Suicidal thoughts, plans, or attempt; a previous suicide attempt by you or a family member ?Thyroid cancer or if someone in your family had thyroid cancer ?An unusual or allergic reaction to semaglutide, other medications, foods, dyes, or preservatives ?Pregnant or trying to get pregnant ?Breast-feeding ?How should I use this medication? ?This medication is injected under the skin. You will be taught how to prepare and give it. Take it as directed on the prescription label. It is given once every week (every 7 days). Keep taking it unless your care team tells you to stop. ?It is important that you put your used needles and pens in a special sharps container. Do not put them in a trash can. If you do not have a sharps container, call your pharmacist or care team to get one. ?A special MedGuide will be given to you by the pharmacist with each prescription and refill. Be sure to read this information carefully each time. ?This medication comes with INSTRUCTIONS FOR  USE. Ask your pharmacist for directions on how to use this medication. Read the information carefully. Talk to your pharmacist or care team if you have questions. ?Talk to your care team about the use of this medication in children. While it may be prescribed for children as young as 12 years for selected conditions, precautions do apply. ?Overdosage: If you think you have taken too much of this medicine contact a poison control center or emergency room at once. ?NOTE: This medicine is only for you. Do not share this medicine with others. ?What if I miss a dose? ?If you miss a dose and the next scheduled dose is more than 2 days away, take the missed dose as soon as possible. If you miss a dose and the next scheduled dose is less than 2 days away, do not take the missed dose. Take the next dose at your regular time. Do not take double or extra doses. If you miss your dose for 2 weeks or more, take the next dose at your regular time or call your care team to talk about how to restart this medication. ?What may interact with this medication? ?Insulin and other medications for diabetes ?This list may not describe all possible interactions. Give your health care provider a list of all the medicines, herbs, non-prescription drugs, or dietary supplements you use. Also tell them if you smoke, drink alcohol, or use illegal drugs. Some items may interact with your medicine. ?What should I watch for while using this medication? ?Visit your care team for regular checks on your progress. It may be  some time before you see the benefit from this medication. ?Drink plenty of fluids while taking this medication. Check with your care team if you have severe diarrhea, nausea, and vomiting, or if you sweat a lot. The loss of too much body fluid may make it dangerous for you to take this medication. ?This medication may affect blood sugar levels. Ask your care team if changes in diet or medications are needed if you have diabetes. ?If  you or your family notice any changes in your behavior, such as new or worsening depression, thoughts of harming yourself, anxiety, other unusual or disturbing thoughts, or memory loss, call your care team right away. ?Women should inform their care team if they wish to become pregnant or think they might be pregnant. Losing weight while pregnant is not advised and may cause harm to the unborn child. Talk to your care team for more information. ?What side effects may I notice from receiving this medication? ?Side effects that you should report to your care team as soon as possible: ?Allergic reactions--skin rash, itching, hives, swelling of the face, lips, tongue, or throat ?Change in vision ?Dehydration--increased thirst, dry mouth, feeling faint or lightheaded, headache, dark yellow or brown urine ?Gallbladder problems--severe stomach pain, nausea, vomiting, fever ?Heart palpitations--rapid, pounding, or irregular heartbeat ?Kidney injury--decrease in the amount of urine, swelling of the ankles, hands, or feet ?Pancreatitis--severe stomach pain that spreads to your back or gets worse after eating or when touched, fever, nausea, vomiting ?Thoughts of suicide or self-harm, worsening mood, feelings of depression ?Thyroid cancer--new mass or lump in the neck, pain or trouble swallowing, trouble breathing, hoarseness ?Side effects that usually do not require medical attention (report to your care team if they continue or are bothersome): ?Diarrhea ?Loss of appetite ?Nausea ?Stomach pain ?Vomiting ?This list may not describe all possible side effects. Call your doctor for medical advice about side effects. You may report side effects to FDA at 1-800-FDA-1088. ?Where should I keep my medication? ?Keep out of the reach of children and pets. ?Refrigeration (preferred): Store in the refrigerator. Do not freeze. Keep this medication in the original container until you are ready to take it. Get rid of any unused medication  after the expiration date. ?Room temperature: If needed, prior to cap removal, the pen can be stored at room temperature for up to 28 days. Protect from light. If it is stored at room temperature, get rid of any unused medication after 28 days or after it expires, whichever is first. ?It is important to get rid of the medication as soon as you no longer need it or it is expired. You can do this in two ways: ?Take the medication to a medication take-back program. Check with your pharmacy or law enforcement to find a location. ?If you cannot return the medication, follow the directions in the MedGuide. ?NOTE: This sheet is a summary. It may not cover all possible information. If you have questions about this medicine, talk to your doctor, pharmacist, or health care provider. ?? 2023 Elsevier/Gold Standard (2021-07-07 00:00:00) ? ?

## 2021-11-12 NOTE — Progress Notes (Signed)
? ? Patient ID: Courtney Jones, female    DOB: 02/13/1981, 41 y.o.   MRN: [id number] ? ?This visit was conducted in person. ? ?BP 120/74   Pulse 77   Temp 97.6 ?F (36.4 ?C) (Temporal)   Ht 5\' 4"  (1.626 m)   Wt 299 lb 2 oz (135.7 kg)   SpO2 98%   BMI 51.34 kg/m?   ? ?CC: discuss weight ?Subjective:  ? ?HPI: ?Courtney SimplerSaunji J Hast is a 41 y.o. female presenting on 11/12/2021 for Weight Management (Wants to discuss starting wt loss med. ) ? ? ?Starting weight: 299 lbs ?Last weight: 299 lbs ?Today's weight 299 lbs ? ?Desires jump start to start weight loss.  ?She saw nutritionist 2017.  ?She has tried weight watchers (~2021), ketogenic diet.  ? ?Would consider bariatric surgery as last option.  ?She checked with insurance - wegovy and saxenda are both covered with PA, Contrave will not be covered.  ? ?No fmhx thyroid cancer.  ? ?24 hour recall: ?10am breakfast: pack of grits with sausage, water  ?1pm Snack - pepsi  ?Skipped lunch  ?7pm dinner: Cava grain/rice bowl with chicken, mixed veggies with greek sauce, water  ?Normally 2 meals/day  ? ?Activity regimen: ?No regular exercise routine  ?   ? ?Relevant past medical, surgical, family and social history reviewed and updated as indicated. Interim medical history since our last visit reviewed. ?Allergies and medications reviewed and updated. ?Outpatient Medications Prior to Visit  ?Medication Sig Dispense Refill  ? Multiple Vitamin (MULTIVITAMIN ADULT PO) Take by mouth daily.    ? Cholecalciferol (VITAMIN D3) 125 MCG (5000 UT) CAPS Take 2 capsules by mouth daily.    ? Ferrous Sulfate (IRON) 325 (65 Fe) MG TABS Take 1 tablet by mouth daily.    ? ?No facility-administered medications prior to visit.  ?  ? ?Per HPI unless specifically indicated in ROS section below ?Review of Systems ? ?Objective:  ?BP 120/74   Pulse 77   Temp 97.6 ?F (36.4 ?C) (Temporal)   Ht 5\' 4"  (1.626 m)   Wt 299 lb 2 oz (135.7 kg)   SpO2 98%   BMI 51.34 kg/m?   ?Wt Readings from Last 3 Encounters:   ?11/12/21 299 lb 2 oz (135.7 kg)  ?10/26/21 296 lb (134.3 kg)  ?12/04/20 (!) 301 lb 8 oz (136.8 kg)  ?  ?  ?Physical Exam ?Vitals and nursing note reviewed.  ?Constitutional:   ?   Appearance: Normal appearance. She is obese. She is not ill-appearing.  ?HENT:  ?   Mouth/Throat:  ?   Mouth: Mucous membranes are moist.  ?   Pharynx: Oropharynx is clear. No oropharyngeal exudate or posterior oropharyngeal erythema.  ?Eyes:  ?   Extraocular Movements: Extraocular movements intact.  ?   Conjunctiva/sclera: Conjunctivae normal.  ?   Pupils: Pupils are equal, round, and reactive to light.  ?Neck:  ?   Thyroid: No thyroid mass, thyromegaly or thyroid tenderness.  ?Cardiovascular:  ?   Rate and Rhythm: Normal rate and regular rhythm.  ?   Pulses: Normal pulses.  ?   Heart sounds: Normal heart sounds. No murmur heard. ?Pulmonary:  ?   Effort: Pulmonary effort is normal. No respiratory distress.  ?   Breath sounds: Normal breath sounds. No wheezing, rhonchi or rales.  ?Musculoskeletal:  ?   Right lower leg: No edema.  ?   Left lower leg: No edema.  ?Skin: ?   General: Skin is warm and dry.  ?  Findings: No rash.  ?Neurological:  ?   Mental Status: She is alert.  ?Psychiatric:     ?   Mood and Affect: Mood normal.     ?   Behavior: Behavior normal.  ? ?   ?Results for orders placed or performed in visit on 11/27/20  ?Microalbumin / creatinine urine ratio  ?Result Value Ref Range  ? Microalb, Ur <0.7 0.0 - 1.9 mg/dL  ? Creatinine,U 146.9 mg/dL  ? Microalb Creat Ratio 0.5 0.0 - 30.0 mg/g  ?IBC panel  ?Result Value Ref Range  ? Iron 37 (L) 42 - 145 ug/dL  ? Transferrin 271.0 212.0 - 360.0 mg/dL  ? Saturation Ratios 9.8 (L) 20.0 - 50.0 %  ?Ferritin  ?Result Value Ref Range  ? Ferritin 47.8 10.0 - 291.0 ng/mL  ?VITAMIN D 25 Hydroxy (Vit-D Deficiency, Fractures)  ?Result Value Ref Range  ? VITD 28.79 (L) 30.00 - 100.00 ng/mL  ?Comprehensive metabolic panel  ?Result Value Ref Range  ? Sodium 142 135 - 145 mEq/L  ? Potassium 4.3 3.5  - 5.1 mEq/L  ? Chloride 105 96 - 112 mEq/L  ? CO2 29 19 - 32 mEq/L  ? Glucose, Bld 93 70 - 99 mg/dL  ? BUN 13 6 - 23 mg/dL  ? Creatinine, Ser 1.16 0.40 - 1.20 mg/dL  ? Total Bilirubin 0.6 0.2 - 1.2 mg/dL  ? Alkaline Phosphatase 100 39 - 117 U/L  ? AST 12 0 - 37 U/L  ? ALT 8 0 - 35 U/L  ? Total Protein 7.1 6.0 - 8.3 g/dL  ? Albumin 3.8 3.5 - 5.2 g/dL  ? GFR 58.68 (L) >60.00 mL/min  ? Calcium 9.4 8.4 - 10.5 mg/dL  ?Lipid panel  ?Result Value Ref Range  ? Cholesterol 200 0 - 200 mg/dL  ? Triglycerides 50.0 0.0 - 149.0 mg/dL  ? HDL 51.20 >39.00 mg/dL  ? VLDL 10.0 0.0 - 40.0 mg/dL  ? LDL Cholesterol 139 (H) 0 - 99 mg/dL  ? Total CHOL/HDL Ratio 4   ? NonHDL 149.24   ?CBC with Differential/Platelet  ?Result Value Ref Range  ? WBC 6.9 4.0 - 10.5 K/uL  ? RBC 4.52 3.87 - 5.11 Mil/uL  ? Hemoglobin 12.1 12.0 - 15.0 g/dL  ? HCT 36.5 36.0 - 46.0 %  ? MCV 80.8 78.0 - 100.0 fl  ? MCHC 33.1 30.0 - 36.0 g/dL  ? RDW 17.7 (H) 11.5 - 15.5 %  ? Platelets 295.0 150.0 - 400.0 K/uL  ? Neutrophils Relative % 50.5 43.0 - 77.0 %  ? Lymphocytes Relative 43.1 12.0 - 46.0 %  ? Monocytes Relative 4.8 3.0 - 12.0 %  ? Eosinophils Relative 1.1 0.0 - 5.0 %  ? Basophils Relative 0.5 0.0 - 3.0 %  ? Neutro Abs 3.5 1.4 - 7.7 K/uL  ? Lymphs Abs 3.0 0.7 - 4.0 K/uL  ? Monocytes Absolute 0.3 0.1 - 1.0 K/uL  ? Eosinophils Absolute 0.1 0.0 - 0.7 K/uL  ? Basophils Absolute 0.0 0.0 - 0.1 K/uL  ? ? ?Assessment & Plan:  ? ?Problem List Items Addressed This Visit   ? ? Morbid obesity with BMI of 50.0-59.9, adult (HCC) - Primary  ?  Encouraged healthy diet and lifestyle choices to affect sustainable weight loss.   ?Discussed activity regimen, reviewed 24-hour food recall. ?She has previously tried weight loss plans including weight watchers and keto-diet.  ?Discussed bariatric medication options - will start Wegovy, discussed mechanism of action, common side effects and adverse  effects, and titration schedule. No fmhx MTC or MEN2.  ?Bariatric medication will be part  of a healthy diet/lifestyle regimen managed through our office. RTC 4-6 wks f/u visit. Check labs at f/u visit/CPE.  ? ?  ?  ? Relevant Medications  ? Semaglutide-Weight Management (WEGOVY) 0.25 MG/0.5ML SOAJ  ? Semaglutide-Weight Management (WEGOVY) 0.5 MG/0.5ML SOAJ  ?  ? ?Meds ordered this encounter  ?Medications  ? Semaglutide-Weight Management (WEGOVY) 0.25 MG/0.5ML SOAJ  ?  Sig: Inject 0.25 mg into the skin once a week.  ?  Dispense:  2 mL  ?  Refill:  0  ?  First month  ? Semaglutide-Weight Management (WEGOVY) 0.5 MG/0.5ML SOAJ  ?  Sig: Inject 0.5 mg into the skin once a week.  ?  Dispense:  2 mL  ?  Refill:  0  ?  Fill on second month  ? ?No orders of the defined types were placed in this encounter. ? ? ? ?Patient instructions: ?Price out [employer] sent to pharmacy.  ?Start at 0.25mg  weekly for 1 month and if tolerated we will continue titration schedule.  ?Work on exercise routine.  ?Return in 4-6 weeks for physical.  ? ?Follow up plan: ?Return in about 4 weeks (around 12/10/2021), or if symptoms worsen or fail to improve, for annual exam, prior fasting for blood work. ? ?Eustaquio Boyden, MD   ?

## 2021-11-12 NOTE — Assessment & Plan Note (Addendum)
Encouraged healthy diet and lifestyle choices to affect sustainable weight loss.   ?Discussed activity regimen, reviewed 24-hour food recall. ?She has previously tried weight loss plans including weight watchers and keto-diet.  ?Discussed bariatric medication options - will start Wegovy, discussed mechanism of action, common side effects and adverse effects, and titration schedule. No fmhx MTC or MEN2.  ?Bariatric medication will be part of a healthy diet/lifestyle regimen managed through our office. RTC 4-6 wks f/u visit. Check labs at f/u visit/CPE.  ?

## 2021-11-13 ENCOUNTER — Telehealth: Payer: Self-pay | Admitting: Family Medicine

## 2021-11-13 NOTE — Telephone Encounter (Signed)
Pt stated she was told wegovy could be covered by her insurance with a PA - can we complete PA for [facility] for patient? Thanks.  ?

## 2021-11-15 NOTE — Telephone Encounter (Signed)
Please see prescription PA denial. ? ?Your prior authorization for Reginal Lutes has been denied. ? ?[facility] ? received a request from your provider for coverage of Wegovy ?0.25MG /0.5ML [place] SOAJ. The request was denied because: ?You do not meet the requirements of your plan. ?Your plan covers this drug when you meet the following conditions: ?- You have participated in a comprehensive weight management program ?- You did this for 6 months prior to using drug therapy ? ? ?

## 2021-11-15 NOTE — Telephone Encounter (Signed)
Please start a PA.

## 2021-11-15 NOTE — Telephone Encounter (Signed)
Left message for patient to call back with most current insurance information.  I attempted starting a Prior Auth for medication, the system could not find matching patient with name/DOB.  Need current insurance info. ?

## 2021-11-15 NOTE — Telephone Encounter (Signed)
Prior Auth started. ?Courtney Jones (Key: [id number]) ?Wegovy 0.25MG /0.5ML auto-injectors ?  ?Determination:  Wait for Determination ?Please wait for [employer] NCPDP 2017 to return a determination. ?

## 2021-11-17 NOTE — Telephone Encounter (Signed)
Left vmail for patient that she needs to come in to sign documentation for appeal for PA on [facility] prescription. ?

## 2021-11-22 ENCOUNTER — Ambulatory Visit
Admission: RE | Admit: 2021-11-22 | Discharge: 2021-11-22 | Disposition: A | Discharge status: Home / Self Care | Payer: BC Managed Care – PPO | Source: Ambulatory Visit | Attending: Obstetrics & Gynecology | Admitting: Obstetrics & Gynecology

## 2021-11-22 DIAGNOSIS — N6310 Unspecified lump in the right breast, unspecified quadrant: Secondary | ICD-10-CM

## 2021-11-22 NOTE — Telephone Encounter (Signed)
Placed form for patient to sign for medication appeal in front office.  Please have patient sign and notify Jae Dire in [place].  Thank you.

## 2021-11-23 ENCOUNTER — Other Ambulatory Visit: Payer: Self-pay | Admitting: Obstetrics & Gynecology

## 2021-11-23 DIAGNOSIS — N6091 Unspecified benign mammary dysplasia of right breast: Secondary | ICD-10-CM

## 2021-11-25 NOTE — Telephone Encounter (Signed)
Made pharmacy aware. States they have to order it but will let pt know when it's ready to pick up.   Spoke with pt relaying info from pharmacy.  Verbalizes understanding and expresses her thanks.

## 2021-11-30 ENCOUNTER — Ambulatory Visit
Admission: RE | Admit: 2021-11-30 | Discharge: 2021-11-30 | Disposition: A | Discharge status: Home / Self Care | Payer: BC Managed Care – PPO | Source: Ambulatory Visit | Attending: Obstetrics & Gynecology | Admitting: Obstetrics & Gynecology

## 2021-11-30 ENCOUNTER — Other Ambulatory Visit (HOSPITAL_COMMUNITY)
Admission: RE | Admit: 2021-11-30 | Discharge: 2021-11-30 | Disposition: A | Discharge status: Home / Self Care | Payer: BC Managed Care – PPO | Source: Ambulatory Visit | Attending: Diagnostic Radiology | Admitting: Diagnostic Radiology

## 2021-11-30 DIAGNOSIS — N6091 Unspecified benign mammary dysplasia of right breast: Secondary | ICD-10-CM | POA: Insufficient documentation

## 2021-12-01 LAB — SURGICAL PATHOLOGY

## 2021-12-03 ENCOUNTER — Ambulatory Visit: Payer: Self-pay | Admitting: Surgery

## 2021-12-21 ENCOUNTER — Ambulatory Visit: Payer: BC Managed Care – PPO | Admitting: Obstetrics & Gynecology

## 2021-12-27 ENCOUNTER — Encounter: Payer: Self-pay | Admitting: Obstetrics & Gynecology

## 2021-12-29 ENCOUNTER — Encounter: Payer: Self-pay | Admitting: Obstetrics & Gynecology

## 2021-12-29 ENCOUNTER — Ambulatory Visit: Payer: BC Managed Care – PPO | Admitting: Obstetrics & Gynecology

## 2021-12-29 VITALS — BP 104/70 | HR 81 | Wt 301.2 lb

## 2021-12-29 DIAGNOSIS — N6091 Unspecified benign mammary dysplasia of right breast: Secondary | ICD-10-CM

## 2021-12-29 DIAGNOSIS — N950 Postmenopausal bleeding: Secondary | ICD-10-CM

## 2021-12-29 MED ORDER — MEGESTROL ACETATE 40 MG PO TABS: 80.0000 mg | ORAL_TABLET | Freq: Two times a day (BID) | ORAL | 5 refills | Status: DC

## 2021-12-29 NOTE — Progress Notes (Signed)
GYNECOLOGY OFFICE VISIT NOTE  History:   Courtney Jones is a 41 y.o. G1P0010 here today for evaluation of heavy bleeding with small clots that started on 12/11/2021. She has not had any bleeding for many years, was noted to have premature menopause in 2016 (06/30/2015 FSH 72).  Reports she was currently diagnosed with right breast atypical lobular hyperplasia, is undergoing follow up with [facility]. This is only recent stressor, no weight changes, no recent intercourse. Denies lightheadedness, presyncopal symptoms.  She denies any abnormal vaginal discharge, bleeding, pelvic pain or other concerns.    Past Medical History:  Diagnosis Date   Amenorrhea 04/25/2016   FSH was high.  Was set up to meet with [facility].   Atypical lobular hyperplasia [facility]) of right breast    CKD (chronic kidney disease) stage 3, GFR 30-59 ml/min (HCC) 04/04/2016   Normal renal US 04/2016 Normal microalb 2021   History of chickenpox    History of trichomonal vaginitis    Infertility associated with anovulation    elevated FSH [facility])   Low grade squamous intraepithelial lesion (LGSIL) on Papanicolaou smear of cervix 2013   s/p colposcopy   Morbid obesity with BMI of 50.0-59.9, adult (HCC)    Saw nutritionist 01/2016   Obesity, Class III, BMI 40-49.9 (morbid obesity) (HCC)    saw nutritionist 01/2016   OSA on CPAP 12/29/2015   Premature ovarian failure 06/25/2020   Vaginal Pap smear, abnormal    Vitamin D deficiency     Past Surgical History:  Procedure Laterality Date   BREAST BIOPSY Right 08/2020   benign fibroadenomatous change   BREAST BIOPSY Right    May 2023 including lymph nodes under the right armpit   INDUCED ABORTION  2002   WISDOM TOOTH EXTRACTION  2016    The following portions of the patient's history were reviewed and updated as appropriate: allergies, current medications, past family history, past medical history, past social history, past surgical history and problem list.    Health Maintenance:  Normal pap and negative HRHPV on 06/25/2020.  Had abnormal mammogram on 10/27/2021, had follow up imaging and biopsies that showed right breast lobular neoplasia/atypical lobular hyperplasia >> following up with [facility].   Review of Systems:  Pertinent items noted in HPI and remainder of comprehensive ROS otherwise negative.  Physical Exam:  BP 104/70   Pulse 81   Wt (!) 301 lb 3.2 oz (136.6 kg)   BMI 51.70 kg/m  CONSTITUTIONAL: Well-developed, well-nourished female in no acute distress.  HEENT:  Normocephalic, atraumatic. External right and left ear normal. No scleral icterus.  NECK: Normal range of motion, supple, no masses noted on observation SKIN: No rash noted. Not diaphoretic. No erythema. No pallor. MUSCULOSKELETAL: Normal range of motion. No edema noted. NEUROLOGIC: Alert and oriented to person, place, and time. Normal muscle tone coordination. No cranial nerve deficit noted. PSYCHIATRIC: Normal mood and affect. Normal behavior. Normal judgment and thought content. CARDIOVASCULAR: Normal heart rate noted RESPIRATORY: Effort and breath sounds normal, no problems with respiration noted ABDOMEN: No masses noted. No other overt distention noted.   PELVIC: Normal appearing external genitalia; normal urethral meatus; normal appearing vaginal mucosa and cervix.  Multiple small clots extruded from cervix, dark blood coming from os. Unable to get adequate pap smear sample. Unable to palpate uterus well due to habitus.   No uterine or adnexal tenderness. Performed in the presence of a chaperone.     Assessment and Plan:  1. Postmenopausal bleeding Discussed etiologies of postmenopausal bleeding, concern about precancerous/hyperplasia or cancerous etiology (5 to 10% percent of cases). Also discussed role of unopposed estrogen exposure in leading to thickened or proliferative endometrium; and its possible correlation with endometrial  hyperplasia/carcinoma.  Discussed that obesity (especially morbid obesity as patient's BMI is 51.7) is linked to endometrial pathology given that adipose cells produce extra estrogen (estrone) which can cause the endometrium to have a significant amount of estrogen exposure.  However, she was reassured that the most common causes of postmenopausal bleeding are benign. The primary goal in the diagnostic evaluation of postmenopausal women with uterine bleeding is to exclude malignancy; this can include endometrial biopsy and pelvic ultrasound.   Labs and ultrasound ordered, will follow up results and manage accordingly.  Megace (progestin therapy) also ordered, this will help stabilize endometrium and hopefully reduce bleeding.  - TSH Rfx on Abnormal to Free T4 - CBC - Follicle stimulating hormone - US PELVIC COMPLETE WITH TRANSVAGINAL; Future - megestrol (MEGACE) 40 MG tablet; Take 2 tablets (80 mg total) by mouth 2 (two) times daily. Until bleeding stops, then can do 80 mg daily until you are seen in the office  Dispense: 120 tablet; Refill: 5 Patient will return for endometrial biopsy and pap smear, premedication recommended.  Routine preventative health maintenance measures emphasized. Please refer to After Visit Summary for other counseling recommendations.   Return in about 1 month (around 01/28/2022) for Followup PMP, needs pap and possible endometrial biopsy.    I spent 30 minutes dedicated to the care of this patient including pre-visit review of records, face to face time with the patient discussing her conditions and treatments and post visit orders.    Jaynie Collins, MD, FACOG Obstetrician & Gynecologist, [facility] for [employer], [facility]

## 2021-12-29 NOTE — Patient Instructions (Signed)
Please take Ibuprofen 600 or 800 mg prior to next appointment

## 2021-12-30 ENCOUNTER — Encounter: Payer: Self-pay | Admitting: Obstetrics & Gynecology

## 2021-12-30 DIAGNOSIS — N950 Postmenopausal bleeding: Secondary | ICD-10-CM | POA: Insufficient documentation

## 2021-12-30 LAB — CBC
Hematocrit: 33.5 % — ABNORMAL LOW (ref 34.0–46.6)
Hemoglobin: 10.9 g/dL — ABNORMAL LOW (ref 11.1–15.9)
MCH: 27.2 pg (ref 26.6–33.0)
MCHC: 32.5 g/dL (ref 31.5–35.7)
MCV: 84 fL (ref 79–97)
Platelets: 341 10*3/uL (ref 150–450)
RBC: 4.01 x10E6/uL (ref 3.77–5.28)
RDW: 15.2 % (ref 11.7–15.4)
WBC: 7.9 10*3/uL (ref 3.4–10.8)

## 2021-12-30 LAB — TSH RFX ON ABNORMAL TO FREE T4: TSH: 2.72 u[IU]/mL (ref 0.450–4.500)

## 2021-12-30 LAB — FOLLICLE STIMULATING HORMONE: FSH: 36.4 m[IU]/mL

## 2022-01-11 ENCOUNTER — Ambulatory Visit
Admission: RE | Admit: 2022-01-11 | Discharge: 2022-01-11 | Disposition: A | Discharge status: Home / Self Care | Payer: BC Managed Care – PPO | Source: Ambulatory Visit | Attending: Obstetrics & Gynecology | Admitting: Obstetrics & Gynecology

## 2022-01-11 DIAGNOSIS — N950 Postmenopausal bleeding: Secondary | ICD-10-CM | POA: Insufficient documentation

## 2022-02-08 ENCOUNTER — Ambulatory Visit: Payer: BC Managed Care – PPO | Admitting: Obstetrics & Gynecology

## 2022-02-08 ENCOUNTER — Other Ambulatory Visit (HOSPITAL_COMMUNITY)
Admission: RE | Admit: 2022-02-08 | Discharge: 2022-02-08 | Disposition: A | Discharge status: Home / Self Care | Payer: BC Managed Care – PPO | Source: Ambulatory Visit | Attending: Obstetrics & Gynecology | Admitting: Obstetrics & Gynecology

## 2022-02-08 ENCOUNTER — Encounter: Payer: Self-pay | Admitting: Obstetrics & Gynecology

## 2022-02-08 VITALS — BP 129/84 | HR 81 | Wt 296.0 lb

## 2022-02-08 DIAGNOSIS — N950 Postmenopausal bleeding: Secondary | ICD-10-CM

## 2022-02-08 DIAGNOSIS — R9389 Abnormal findings on diagnostic imaging of other specified body structures: Secondary | ICD-10-CM

## 2022-02-08 NOTE — Patient Instructions (Signed)

## 2022-02-08 NOTE — Progress Notes (Signed)
      GYNECOLOGY OFFICE PROCEDURE NOTE   Courtney Jones is a 41 y.o. G1P0010 here for endometrial biopsy for postmenopausal bleeding. Recent ultrasound on 01/11/2022 also showed 18 mm mildly heterogenous endometrial stripe.  Today, she reports no concerning symptoms. Of note, pap on 06/25/2020 was normal, negative HPV.   ENDOMETRIAL BIOPSY     The indications for endometrial biopsy were reviewed.   Risks of the biopsy including cramping, bleeding, infection, uterine perforation, inadequate specimen and need for additional procedures were discussed. Offered alternative of hysteroscopy, dilation and curettage in OR. The patient states she understands the R/B/I/A and agrees to undergo procedure today. Urine pregnancy test was negative. Consent was signed. Time out was performed. Chaperone was present during entire procedure.   Patient was positioned in dorsal lithotomy position. A vaginal speculum was placed.  The cervix was visualized and pap smear sample was collected.  Cervix was then prepped with Betadine.  A single-toothed tenaculum was placed on the anterior lip of the cervix to stabilize it.  A paracervical block with 10 ml of 2% lidocaine with epinephrine was administered. The 3 mm pipelle was easily introduced into the endometrial cavity without difficulty to a depth of 8 cm, and a moderate amount of tissue was obtained after two passes and sent to pathology. The instruments were removed from the patient's vagina. Minimal bleeding from the cervix was noted. The patient tolerated the procedure well.   Patient was given post procedure instructions.  Will follow up pathology and manage accordingly; patient will be contacted with results and recommendations.  Routine preventative health maintenance measures emphasized.      Jaynie Collins, MD, FACOG Obstetrician & Gynecologist, [facility] for [employer], [facility]

## 2022-02-11 LAB — SURGICAL PATHOLOGY

## 2022-02-14 LAB — CYTOLOGY - PAP
Comment: NEGATIVE
Diagnosis: NEGATIVE
Diagnosis: REACTIVE
High risk HPV: NEGATIVE

## 2022-04-17 ENCOUNTER — Other Ambulatory Visit: Payer: Self-pay | Admitting: Family Medicine

## 2022-04-17 DIAGNOSIS — E559 Vitamin D deficiency, unspecified: Secondary | ICD-10-CM

## 2022-04-17 DIAGNOSIS — E611 Iron deficiency: Secondary | ICD-10-CM

## 2022-04-17 DIAGNOSIS — N1831 Chronic kidney disease, stage 3a: Secondary | ICD-10-CM

## 2022-04-18 ENCOUNTER — Other Ambulatory Visit (INDEPENDENT_AMBULATORY_CARE_PROVIDER_SITE_OTHER): Payer: BC Managed Care – PPO

## 2022-04-18 DIAGNOSIS — E611 Iron deficiency: Secondary | ICD-10-CM | POA: Diagnosis not present

## 2022-04-18 DIAGNOSIS — E559 Vitamin D deficiency, unspecified: Secondary | ICD-10-CM | POA: Diagnosis not present

## 2022-04-18 DIAGNOSIS — N1831 Chronic kidney disease, stage 3a: Secondary | ICD-10-CM

## 2022-04-18 LAB — COMPREHENSIVE METABOLIC PANEL
ALT: 7 U/L (ref 0–35)
AST: 11 U/L (ref 0–37)
Albumin: 3.9 g/dL (ref 3.5–5.2)
Alkaline Phosphatase: 104 U/L (ref 39–117)
BUN: 12 mg/dL (ref 6–23)
CO2: 28 mEq/L (ref 19–32)
Calcium: 9.3 mg/dL (ref 8.4–10.5)
Chloride: 104 mEq/L (ref 96–112)
Creatinine, Ser: 1.35 mg/dL — ABNORMAL HIGH (ref 0.40–1.20)
GFR: 48.44 mL/min — ABNORMAL LOW (ref >60.00)
Glucose, Bld: 99 mg/dL (ref 70–99)
Potassium: 4.4 mEq/L (ref 3.5–5.1)
Sodium: 140 mEq/L (ref 135–145)
Total Bilirubin: 0.6 mg/dL (ref 0.2–1.2)
Total Protein: 7.2 g/dL (ref 6.0–8.3)

## 2022-04-18 LAB — IBC PANEL
Iron: 36 ug/dL — ABNORMAL LOW (ref 42–145)
Saturation Ratios: 10.4 % — ABNORMAL LOW (ref 20.0–50.0)
TIBC: 345.8 ug/dL (ref 250.0–450.0)
Transferrin: 247 mg/dL (ref 212.0–360.0)

## 2022-04-18 LAB — CBC WITH DIFFERENTIAL/PLATELET
Basophils Absolute: 0 10*3/uL (ref 0.0–0.1)
Basophils Relative: 0.3 % (ref 0.0–3.0)
Eosinophils Absolute: 0.1 10*3/uL (ref 0.0–0.7)
Eosinophils Relative: 0.9 % (ref 0.0–5.0)
HCT: 35.9 % — ABNORMAL LOW (ref 36.0–46.0)
Hemoglobin: 11.5 g/dL — ABNORMAL LOW (ref 12.0–15.0)
Lymphocytes Relative: 44.1 % (ref 12.0–46.0)
Lymphs Abs: 2.8 10*3/uL (ref 0.7–4.0)
MCHC: 31.9 g/dL (ref 30.0–36.0)
MCV: 80.3 fl (ref 78.0–100.0)
Monocytes Absolute: 0.3 10*3/uL (ref 0.1–1.0)
Monocytes Relative: 5.6 % (ref 3.0–12.0)
Neutro Abs: 3.1 10*3/uL (ref 1.4–7.7)
Neutrophils Relative %: 49.1 % (ref 43.0–77.0)
Platelets: 292 10*3/uL (ref 150.0–400.0)
RBC: 4.46 Mil/uL (ref 3.87–5.11)
RDW: 17 % — ABNORMAL HIGH (ref 11.5–15.5)
WBC: 6.3 10*3/uL (ref 4.0–10.5)

## 2022-04-18 LAB — LIPID PANEL
Cholesterol: 184 mg/dL (ref 0–200)
HDL: 44.8 mg/dL (ref >39.00)
LDL Cholesterol: 128 mg/dL — ABNORMAL HIGH (ref 0–99)
NonHDL: 139.2
Total CHOL/HDL Ratio: 4
Triglycerides: 55 mg/dL (ref 0.0–149.0)
VLDL: 11 mg/dL (ref 0.0–40.0)

## 2022-04-18 LAB — FERRITIN: Ferritin: 28.6 ng/mL (ref 10.0–291.0)

## 2022-04-18 LAB — MICROALBUMIN / CREATININE URINE RATIO
Creatinine,U: 207.7 mg/dL
Microalb Creat Ratio: 0.5 mg/g (ref 0.0–30.0)
Microalb, Ur: 1.1 mg/dL (ref 0.0–1.9)

## 2022-04-18 LAB — VITAMIN D 25 HYDROXY (VIT D DEFICIENCY, FRACTURES): VITD: 38.19 ng/mL (ref 30.00–100.00)

## 2022-04-25 ENCOUNTER — Ambulatory Visit (INDEPENDENT_AMBULATORY_CARE_PROVIDER_SITE_OTHER): Payer: BC Managed Care – PPO | Admitting: Family Medicine

## 2022-04-25 ENCOUNTER — Encounter: Payer: Self-pay | Admitting: Family Medicine

## 2022-04-25 VITALS — BP 118/68 | HR 96 | Temp 97.6°F | Ht 65.5 in | Wt 296.5 lb

## 2022-04-25 DIAGNOSIS — N6091 Unspecified benign mammary dysplasia of right breast: Secondary | ICD-10-CM

## 2022-04-25 DIAGNOSIS — N1831 Chronic kidney disease, stage 3a: Secondary | ICD-10-CM

## 2022-04-25 DIAGNOSIS — Z23 Encounter for immunization: Secondary | ICD-10-CM | POA: Diagnosis not present

## 2022-04-25 DIAGNOSIS — D631 Anemia in chronic kidney disease: Secondary | ICD-10-CM

## 2022-04-25 DIAGNOSIS — Z0000 Encounter for general adult medical examination without abnormal findings: Secondary | ICD-10-CM

## 2022-04-25 DIAGNOSIS — Z6841 Body Mass Index (BMI) 40.0 and over, adult: Secondary | ICD-10-CM

## 2022-04-25 DIAGNOSIS — N183 Chronic kidney disease, stage 3 unspecified: Secondary | ICD-10-CM

## 2022-04-25 DIAGNOSIS — G4733 Obstructive sleep apnea (adult) (pediatric): Secondary | ICD-10-CM

## 2022-04-25 DIAGNOSIS — E611 Iron deficiency: Secondary | ICD-10-CM

## 2022-04-25 DIAGNOSIS — E559 Vitamin D deficiency, unspecified: Secondary | ICD-10-CM

## 2022-04-25 LAB — POC URINALSYSI DIPSTICK (AUTOMATED)
Bilirubin, UA: NEGATIVE
Blood, UA: NEGATIVE
Glucose, UA: NEGATIVE
Ketones, UA: NEGATIVE
Leukocytes, UA: NEGATIVE
Nitrite, UA: NEGATIVE
Protein, UA: NEGATIVE
Spec Grav, UA: 1.02 (ref 1.010–1.025)
Urobilinogen, UA: 0.2 E.U./dL
pH, UA: 6 (ref 5.0–8.0)

## 2022-04-25 MED ORDER — WEGOVY 0.25 MG/0.5ML ~~LOC~~ SOAJ: 0.2500 mg | SUBCUTANEOUS | 0 refills | Status: AC

## 2022-04-25 MED ORDER — WEGOVY 0.5 MG/0.5ML ~~LOC~~ SOAJ: 0.5000 mg | SUBCUTANEOUS | 1 refills | Status: AC

## 2022-04-25 NOTE — Assessment & Plan Note (Signed)
Seeing CCS, appreciate surgery care.

## 2022-04-25 NOTE — Assessment & Plan Note (Addendum)
Encourage healthy diet and lifestyle.  She was unable to try Wegovy due to nationwide shortage - [facility] never had this available since 11/2021.  Rx printed out for patient to search different local pharmacies to see if any have this medication. I also provided her with [facility] pharmacy # to call and see if [facility] available.

## 2022-04-25 NOTE — Progress Notes (Signed)
Patient ID: Courtney Jones, female    DOB: 1981-01-26, 41 y.o.   MRN: [id number]  This visit was conducted in person.  BP 118/68   Pulse 96   Temp 97.6 F (36.4 C) (Temporal)   Ht 5' 5.5" (1.664 m)   Wt 296 lb 8 oz (134.5 kg)   LMP 04/04/2022   SpO2 99%   BMI 48.59 kg/m    CC: CPE Subjective:   HPI: Courtney Jones is a 40 y.o. female presenting on 04/25/2022 for Annual Exam (Wants to discuss new Wegovy rx. )   Mild OSA previously on CPAP. Has not been using CPAP, considering restarting. No significant daytime sleepiness. Notes some sleep maintenance insomnia for last several months.   Obesity - Wegovy started 11/2021 (approved with [place]). Was unable to start it however due to shortage. Pharmacy is [facility].   Preventative: Well woman Dr Macon Large - normal paps 2017, 2021, h/o HPV remotely. Last well woman was 06/2020, normal mammogram  S/p benign endometrial biopsy 02/2022.  Mammogram - abnormal 08/2020 s/p benign biopsy (fibroadenomatuos changes) saw surgeon Dr Corliss Skains for small focus of atypical lobular hyperplasia - rec rpt dx mammo and [place] in 6 months then f/u with gen surgery.  LMP 04/04/2022, becoming more regularly again Flu shot yearly at work COVID vaccines [employer] 08/2019, 09/2019, booster x1 05/2020 Tdap 02/2010, Tdap today Seat belt use discussed Sunscreen use discussed. No changing moles on skin.  Non smoker Alcohol - rare Dentist q6 mo  Eye exam 1-2 yrs  Marital status: single Edu: college Chemical engineer)  Occupation: was [employer]	[place], now working for [employer] (work from home) Activity: some walking  Diet: good water, fruits/vegetables daily      Relevant past medical, surgical, family and social history reviewed and updated as indicated. Interim medical history since our last visit reviewed. Allergies and medications reviewed and updated. Outpatient Medications Prior to Visit  Medication Sig Dispense Refill   Multiple Vitamin (MULTIVITAMIN  ADULT PO) Take by mouth daily.     megestrol (MEGACE) 40 MG tablet Take 2 tablets (80 mg total) by mouth 2 (two) times daily. Until bleeding stops, then can do 80 mg daily until you are seen in the office (Patient not taking: Reported on 04/25/2022) 120 tablet 5   No facility-administered medications prior to visit.     Per HPI unless specifically indicated in ROS section below Review of Systems  Constitutional:  Negative for activity change, appetite change, chills, fatigue, fever and unexpected weight change.  HENT:  Negative for hearing loss.   Eyes:  Negative for visual disturbance.  Respiratory:  Negative for cough, chest tightness, shortness of breath and wheezing.   Cardiovascular:  Negative for chest pain, palpitations and leg swelling.  Gastrointestinal:  Negative for abdominal distention, abdominal pain, blood in stool, constipation, diarrhea, nausea and vomiting.  Genitourinary:  Negative for difficulty urinating and hematuria.  Musculoskeletal:  Negative for arthralgias, myalgias and neck pain.  Skin:  Negative for rash.  Neurological:  Negative for dizziness, seizures, syncope and headaches.  Hematological:  Negative for adenopathy. Does not bruise/bleed easily.  Psychiatric/Behavioral:  Negative for dysphoric mood. The patient is nervous/anxious (episode of anxiety last week that resolved on its own).     Objective:  BP 118/68   Pulse 96   Temp 97.6 F (36.4 C) (Temporal)   Ht 5' 5.5" (1.664 m)   Wt 296 lb 8 oz (134.5 kg)   LMP 04/04/2022  SpO2 99%   BMI 48.59 kg/m   Wt Readings from Last 3 Encounters:  04/25/22 296 lb 8 oz (134.5 kg)  02/08/22 296 lb (134.3 kg)  12/29/21 (!) 301 lb 3.2 oz (136.6 kg)      Physical Exam Vitals and nursing note reviewed.  Constitutional:      Appearance: Normal appearance. She is obese. She is not ill-appearing.  HENT:     Head: Normocephalic and atraumatic.     Right Ear: Tympanic membrane, ear canal and external ear normal.  There is no impacted cerumen.     Left Ear: Tympanic membrane, ear canal and external ear normal. There is no impacted cerumen.  Eyes:     General:        Right eye: No discharge.        Left eye: No discharge.     Extraocular Movements: Extraocular movements intact.     Conjunctiva/sclera: Conjunctivae normal.     Pupils: Pupils are equal, round, and reactive to light.  Neck:     Thyroid: No thyroid mass or thyromegaly.  Cardiovascular:     Rate and Rhythm: Normal rate and regular rhythm.     Pulses: Normal pulses.     Heart sounds: Normal heart sounds. No murmur heard. Pulmonary:     Effort: Pulmonary effort is normal. No respiratory distress.     Breath sounds: Normal breath sounds. No wheezing, rhonchi or rales.  Abdominal:     General: Bowel sounds are normal. There is no distension.     Palpations: Abdomen is soft. There is no mass.     Tenderness: There is no abdominal tenderness. There is no guarding or rebound.     Hernia: No hernia is present.  Musculoskeletal:     Cervical back: Normal range of motion and neck supple. No rigidity.     Right lower leg: No edema.     Left lower leg: No edema.  Lymphadenopathy:     Cervical: No cervical adenopathy.  Skin:    General: Skin is warm and dry.     Findings: No rash.  Neurological:     General: No focal deficit present.     Mental Status: She is alert. Mental status is at baseline.  Psychiatric:        Mood and Affect: Mood normal.        Behavior: Behavior normal.       Results for orders placed or performed in visit on 04/25/22  POCT Urinalysis Dipstick (Automated)  Result Value Ref Range   Color, UA yellow    Clarity, UA cloudy    Glucose, UA Negative Negative   Bilirubin, UA negative    Ketones, UA negative    Spec Grav, UA 1.020 1.010 - 1.025   Blood, UA negative    pH, UA 6.0 5.0 - 8.0   Protein, UA Negative Negative   Urobilinogen, UA 0.2 0.2 or 1.0 E.U./dL   Nitrite, UA negative    Leukocytes, UA  Negative Negative    Assessment & Plan:   Problem List Items Addressed This Visit     Health maintenance examination - Primary (Chronic)    Preventative protocols reviewed and updated unless pt declined. Discussed healthy diet and lifestyle.       Morbid obesity with BMI of 50.0-59.9, adult (HCC)    Encourage healthy diet and lifestyle.  She was unable to try Wegovy due to nationwide shortage - [facility] never had this available since 11/2021.  Rx  printed out for patient to search different local pharmacies to see if any have this medication. I also provided her with [facility] pharmacy # to call and see if [facility] available.        Relevant Medications   Semaglutide-Weight Management (WEGOVY) 0.25 MG/0.5ML SOAJ   Semaglutide-Weight Management (WEGOVY) 0.5 MG/0.5ML SOAJ   OSA (obstructive sleep apnea)    Mild not on CPAP      Vitamin D deficiency    She only continues MVI with improved levels.       CKD (chronic kidney disease) stage 3, GFR 30-59 ml/min (HCC)    Chronic, stable. Reviewed data over last several years.  Check urinalysis today.  No h/o HTN, DM. Consider SPEP next labwork.       Relevant Orders   POCT Urinalysis Dipstick (Automated) (Completed)   Anemia    Mild anemia persists.       Iron deficiency    She will restart oral iron.       Atypical lobular hyperplasia [facility]) of right breast    Seeing CCS, appreciate surgery care.       Other Visit Diagnoses     Need for influenza vaccination       Relevant Orders   Flu Vaccine QUAD 46mo+IM (Fluarix, Fluzone & Alfiuria Quad PF) (Completed)   Need for Tdap vaccination       Relevant Orders   Tdap vaccine greater than or equal to 7yo IM (Completed)        Meds ordered this encounter  Medications   Semaglutide-Weight Management (WEGOVY) 0.25 MG/0.5ML SOAJ    Sig: Inject 0.25 mg into the skin once a week.    Dispense:  2 mL    Refill:  0   Semaglutide-Weight Management (WEGOVY) 0.5 MG/0.5ML SOAJ    Sig:  Inject 0.5 mg into the skin once a week.    Dispense:  2 mL    Refill:  1   Orders Placed This Encounter  Procedures   Flu Vaccine QUAD 55mo+IM (Fluarix, Fluzone & Alfiuria Quad PF)   Tdap vaccine greater than or equal to 7yo IM   POCT Urinalysis Dipstick (Automated)    Patient instructions: Flu shot today.  Tetanus shot today (Tdap).  Urinalysis today  [facility] prescription printed out  - check with local pharmacies for availability. May try [facility] pharmacy at [phone].  Call to schedule follow up mammogram/ultrasound at your convenience: [place] [phone] Good to see you today Return as needed or in 2 months after starting wegovy, and in 1 year for next physical.   Follow up plan: Return in about 1 year (around 04/26/2023) for annual exam, prior fasting for blood work.  Ria Bush, MD

## 2022-04-25 NOTE — Assessment & Plan Note (Signed)
Mild not on CPAP

## 2022-04-25 NOTE — Patient Instructions (Addendum)
Flu shot today.  Tetanus shot today (Tdap).  Urinalysis today  [facility] prescription printed out  - check with local pharmacies for availability. May try [facility] pharmacy at [phone].  Call to schedule follow up mammogram/ultrasound at your convenience: [place] [phone] Good to see you today Return as needed or in 2 months after starting wegovy, and in 1 year for next physical.   Health Maintenance, Female Adopting a healthy lifestyle and getting preventive care are important in promoting health and wellness. Ask your health care provider about: The right schedule for you to have regular tests and exams. Things you can do on your own to prevent diseases and keep yourself healthy. What should I know about diet, weight, and exercise? Eat a healthy diet  Eat a diet that includes plenty of vegetables, fruits, low-fat dairy products, and lean protein. Do not eat a lot of foods that are high in solid fats, added sugars, or sodium. Maintain a healthy weight Body mass index (BMI) is used to identify weight problems. It estimates body fat based on height and weight. Your health care provider can help determine your BMI and help you achieve or maintain a healthy weight. Get regular exercise Get regular exercise. This is one of the most important things you can do for your health. Most adults should: Exercise for at least 150 minutes each week. The exercise should increase your heart rate and make you sweat (moderate-intensity exercise). Do strengthening exercises at least twice a week. This is in addition to the moderate-intensity exercise. Spend less time sitting. Even light physical activity can be beneficial. Watch cholesterol and blood lipids Have your blood tested for lipids and cholesterol at 42 years of age, then have this test every 5 years. Have your cholesterol levels checked more often if: Your lipid or cholesterol levels are high. You are older than 42 years  of age. You are at high risk for heart disease. What should I know about cancer screening? Depending on your health history and family history, you may need to have cancer screening at various ages. This may include screening for: Breast cancer. Cervical cancer. Colorectal cancer. Skin cancer. Lung cancer. What should I know about heart disease, diabetes, and high blood pressure? Blood pressure and heart disease High blood pressure causes heart disease and increases the risk of stroke. This is more likely to develop in people who have high blood pressure readings or are overweight. Have your blood pressure checked: Every 3-5 years if you are 8-69 years of age. Every year if you are 107 years old or older. Diabetes Have regular diabetes screenings. This checks your fasting blood sugar level. Have the screening done: Once every three years after age 42 if you are at a normal weight and have a low risk for diabetes. More often and at a younger age if you are overweight or have a high risk for diabetes. What should I know about preventing infection? Hepatitis B If you have a higher risk for hepatitis B, you should be screened for this virus. Talk with your health care provider to find out if you are at risk for hepatitis B infection. Hepatitis C Testing is recommended for: Everyone born from [birth year] through 1966. Anyone with known risk factors for hepatitis C. Sexually transmitted infections (STIs) Get screened for STIs, including gonorrhea and chlamydia, if: You are sexually active and are younger than 42 years of age. You are older than 42 years of age and your health  care provider tells you that you are at risk for this type of infection. Your sexual activity has changed since you were last screened, and you are at increased risk for chlamydia or gonorrhea. Ask your health care provider if you are at risk. Ask your health care provider about whether you are at high risk for HIV. Your  health care provider may recommend a prescription medicine to help prevent HIV infection. If you choose to take medicine to prevent HIV, you should first get tested for HIV. You should then be tested every 3 months for as long as you are taking the medicine. Pregnancy If you are about to stop having your period (premenopausal) and you may become pregnant, seek counseling before you get pregnant. Take 400 to 800 micrograms (mcg) of folic acid every day if you become pregnant. Ask for birth control (contraception) if you want to prevent pregnancy. Osteoporosis and menopause Osteoporosis is a disease in which the bones lose minerals and strength with aging. This can result in bone fractures. If you are 89 years old or older, or if you are at risk for osteoporosis and fractures, ask your health care provider if you should: Be screened for bone loss. Take a calcium or vitamin D supplement to lower your risk of fractures. Be given hormone replacement therapy (HRT) to treat symptoms of menopause. Follow these instructions at home: Alcohol use Do not drink alcohol if: Your health care provider tells you not to drink. You are pregnant, may be pregnant, or are planning to become pregnant. If you drink alcohol: Limit how much you have to: 0-1 drink a day. Know how much alcohol is in your drink. In the [place] one drink equals one 12 oz bottle of beer (355 mL), one 5 oz glass of wine (148 mL), or one 1 oz glass of hard liquor (44 mL). Lifestyle Do not use any products that contain nicotine or tobacco. These products include cigarettes, chewing tobacco, and vaping devices, such as e-cigarettes. If you need help quitting, ask your health care provider. Do not use street drugs. Do not share needles. Ask your health care provider for help if you need support or information about quitting drugs. General instructions Schedule regular health, dental, and eye exams. Stay current with your vaccines. Tell your  health care provider if: You often feel depressed. You have ever been abused or do not feel safe at home. Summary Adopting a healthy lifestyle and getting preventive care are important in promoting health and wellness. Follow your health care provider's instructions about healthy diet, exercising, and getting tested or screened for diseases. Follow your health care provider's instructions on monitoring your cholesterol and blood pressure. This information is not intended to replace advice given to you by your health care provider. Make sure you discuss any questions you have with your health care provider. Document Revised: 11/09/2020 Document Reviewed: 11/09/2020 Elsevier Patient Education  2023 [employer].

## 2022-04-25 NOTE — Assessment & Plan Note (Signed)
Preventative protocols reviewed and updated unless pt declined. Discussed healthy diet and lifestyle.  

## 2022-04-25 NOTE — Assessment & Plan Note (Signed)
She only continues MVI with improved levels.

## 2022-04-25 NOTE — Assessment & Plan Note (Signed)
Mild anemia persists.

## 2022-04-25 NOTE — Assessment & Plan Note (Signed)
She will restart oral iron.

## 2022-04-25 NOTE — Assessment & Plan Note (Signed)
Chronic, stable. Reviewed data over last several years.  Check urinalysis today.  No h/o HTN, DM. Consider SPEP next labwork.

## 2022-09-16 ENCOUNTER — Other Ambulatory Visit: Payer: Self-pay | Admitting: Obstetrics & Gynecology

## 2022-09-16 DIAGNOSIS — D493 Neoplasm of unspecified behavior of breast: Secondary | ICD-10-CM

## 2022-10-14 ENCOUNTER — Other Ambulatory Visit: Payer: Self-pay | Admitting: Obstetrics & Gynecology

## 2022-10-14 DIAGNOSIS — D493 Neoplasm of unspecified behavior of breast: Secondary | ICD-10-CM

## 2022-10-31 ENCOUNTER — Ambulatory Visit
Admission: RE | Admit: 2022-10-31 | Discharge: 2022-10-31 | Disposition: A | Discharge status: Home / Self Care | Payer: BC Managed Care – PPO | Source: Ambulatory Visit | Attending: Obstetrics & Gynecology | Admitting: Obstetrics & Gynecology

## 2022-10-31 DIAGNOSIS — D493 Neoplasm of unspecified behavior of breast: Secondary | ICD-10-CM

## 2022-12-30 ENCOUNTER — Encounter: Payer: Self-pay | Admitting: Obstetrics & Gynecology

## 2023-01-18 ENCOUNTER — Ambulatory Visit: Payer: BC Managed Care – PPO | Admitting: Obstetrics & Gynecology

## 2023-03-01 ENCOUNTER — Ambulatory Visit: Payer: BC Managed Care – PPO | Admitting: Obstetrics & Gynecology

## 2023-03-01 ENCOUNTER — Encounter: Payer: Self-pay | Admitting: Obstetrics & Gynecology

## 2023-03-01 VITALS — BP 131/81 | HR 84 | Wt 294.0 lb

## 2023-03-01 DIAGNOSIS — Z01419 Encounter for gynecological examination (general) (routine) without abnormal findings: Secondary | ICD-10-CM | POA: Diagnosis not present

## 2023-03-01 NOTE — Progress Notes (Signed)
GYNECOLOGY ANNUAL PREVENTATIVE CARE ENCOUNTER NOTE  History:     Courtney Jones is a 42 y.o. G98P0010 female here for a routine annual gynecologic exam.  Current complaints: none.  No further bleeding since last episode last year, had negative evaluation.  Denies abnormal vaginal bleeding, discharge, pelvic pain, problems with intercourse or other gynecologic concerns.    Gynecologic History Patient's last menstrual period was 04/04/2022. Contraception: Abstinence Last Pap: 02/08/2022. Result was normal with negative HPV. Last Mammogram: 10/31/2022  Result was abnormal, but had benign biopsy  Obstetric History OB History  Gravida Para Term Preterm AB Living  1       1    SAB IAB Ectopic Multiple Live Births    1          # Outcome Date GA Lbr Len/2nd Weight Sex Type Anes PTL Lv  1 IAB             Past Medical History:  Diagnosis Date   Amenorrhea 04/25/2016   FSH was high.  Was set up to meet with REI.   Atypical lobular hyperplasia [facility]) of right breast    CKD (chronic kidney disease) stage 3, GFR 30-59 ml/min (HCC) 04/04/2016   Normal renal US 04/2016 Normal microalb 2021   History of chickenpox    History of trichomonal vaginitis    Infertility associated with anovulation    elevated FSH [facility])   Low grade squamous intraepithelial lesion (LGSIL) on Papanicolaou smear of cervix 2013   s/p colposcopy   Morbid obesity with BMI of 50.0-59.9, adult (HCC)    Saw nutritionist 01/2016   Obesity, Class III, BMI 40-49.9 (morbid obesity) (HCC)    saw nutritionist 01/2016   OSA on CPAP 12/29/2015   Premature ovarian failure 06/25/2020   Vaginal Pap smear, abnormal    Vitamin D deficiency     Past Surgical History:  Procedure Laterality Date   BREAST BIOPSY Right 08/2020   benign fibroadenomatous change   BREAST BIOPSY Right    May 2023 including lymph nodes under the right armpit   INDUCED ABORTION  2002   WISDOM TOOTH EXTRACTION  2016    Current Outpatient  Medications on File Prior to Visit  Medication Sig Dispense Refill   Multiple Vitamin (MULTIVITAMIN ADULT PO) Take by mouth daily.     Semaglutide-Weight Management (WEGOVY) 0.25 MG/0.5ML SOAJ Inject 0.25 mg into the skin once a week. 2 mL 0   Semaglutide-Weight Management (WEGOVY) 0.5 MG/0.5ML SOAJ Inject 0.5 mg into the skin once a week. 2 mL 1   No current facility-administered medications on file prior to visit.    No Known Allergies  Social History:  reports that she has never smoked. She has never used smokeless tobacco. She reports current alcohol use. She reports that she does not use drugs.  Family History  Problem Relation Age of Onset   Rheum arthritis Mother    CAD Father 54       MI   Breast cancer Maternal Grandmother 54   Kidney disease Cousin        (second cousin) kidney transplant   Kidney disease Cousin        (second cousin) unsure   Stroke Neg Hx    Diabetes Neg Hx     The following portions of the patient's history were reviewed and updated as appropriate: allergies, current medications, past family history, past medical history, past social history, past surgical history and problem list.  Review of  Systems Pertinent items noted in HPI and remainder of comprehensive ROS otherwise negative.  Physical Exam:  BP 131/81   Pulse 84   Wt 294 lb (133.4 kg)   LMP 04/04/2022   BMI 48.18 kg/m  CONSTITUTIONAL: Well-developed, well-nourished female in no acute distress.  HENT:  Normocephalic, atraumatic, External right and left ear normal.  EYES: Conjunctivae and EOM are normal. Pupils are equal, round, and reactive to light. No scleral icterus.  NECK: Normal range of motion, supple, no masses.  Normal thyroid.  SKIN: Skin is warm and dry. No rash noted. Not diaphoretic. No erythema. No pallor. MUSCULOSKELETAL: Normal range of motion. No tenderness.  No cyanosis, clubbing, or edema. NEUROLOGIC: Alert and oriented to person, place, and time. Normal reflexes,  muscle tone coordination.  PSYCHIATRIC: Normal mood and affect. Normal behavior. Normal judgment and thought content. CARDIOVASCULAR: Normal heart rate noted, regular rhythm RESPIRATORY: Clear to auscultation bilaterally. Effort and breath sounds normal, no problems with respiration noted. BREASTS: Large, symmetric in size. No masses, tenderness, skin changes, nipple drainage, or lymphadenopathy bilaterally. Performed in the presence of a chaperone. ABDOMEN: Soft, obese, no distention noted.  No tenderness, rebound or guarding.  PELVIC: Deferred.   Assessment and Plan:     1. Well woman exam with routine gynecological exam Pap smear  and breast cancer screening are up to date Routine preventative health maintenance measures emphasized. Please refer to After Visit Summary for other counseling recommendations.      Jaynie Collins, MD, FACOG Obstetrician & Gynecologist, [facility] for [employer], [facility]

## 2023-09-20 ENCOUNTER — Other Ambulatory Visit: Payer: Self-pay | Admitting: Obstetrics & Gynecology

## 2023-09-20 DIAGNOSIS — N6310 Unspecified lump in the right breast, unspecified quadrant: Secondary | ICD-10-CM

## 2023-09-20 DIAGNOSIS — Z09 Encounter for follow-up examination after completed treatment for conditions other than malignant neoplasm: Secondary | ICD-10-CM

## 2023-10-09 IMAGING — MG MM BREAST LOCALIZATION CLIP
4 series · 4 of 12 positions shown · non-contrast
Comparison: Previous exam(s).

CLINICAL DATA: Status post ultrasound-guided core biopsy of a right
breast mass.

EXAM:
3D DIAGNOSTIC RIGHT MAMMOGRAM POST ULTRASOUND BIOPSY

[R CC synth-2D]
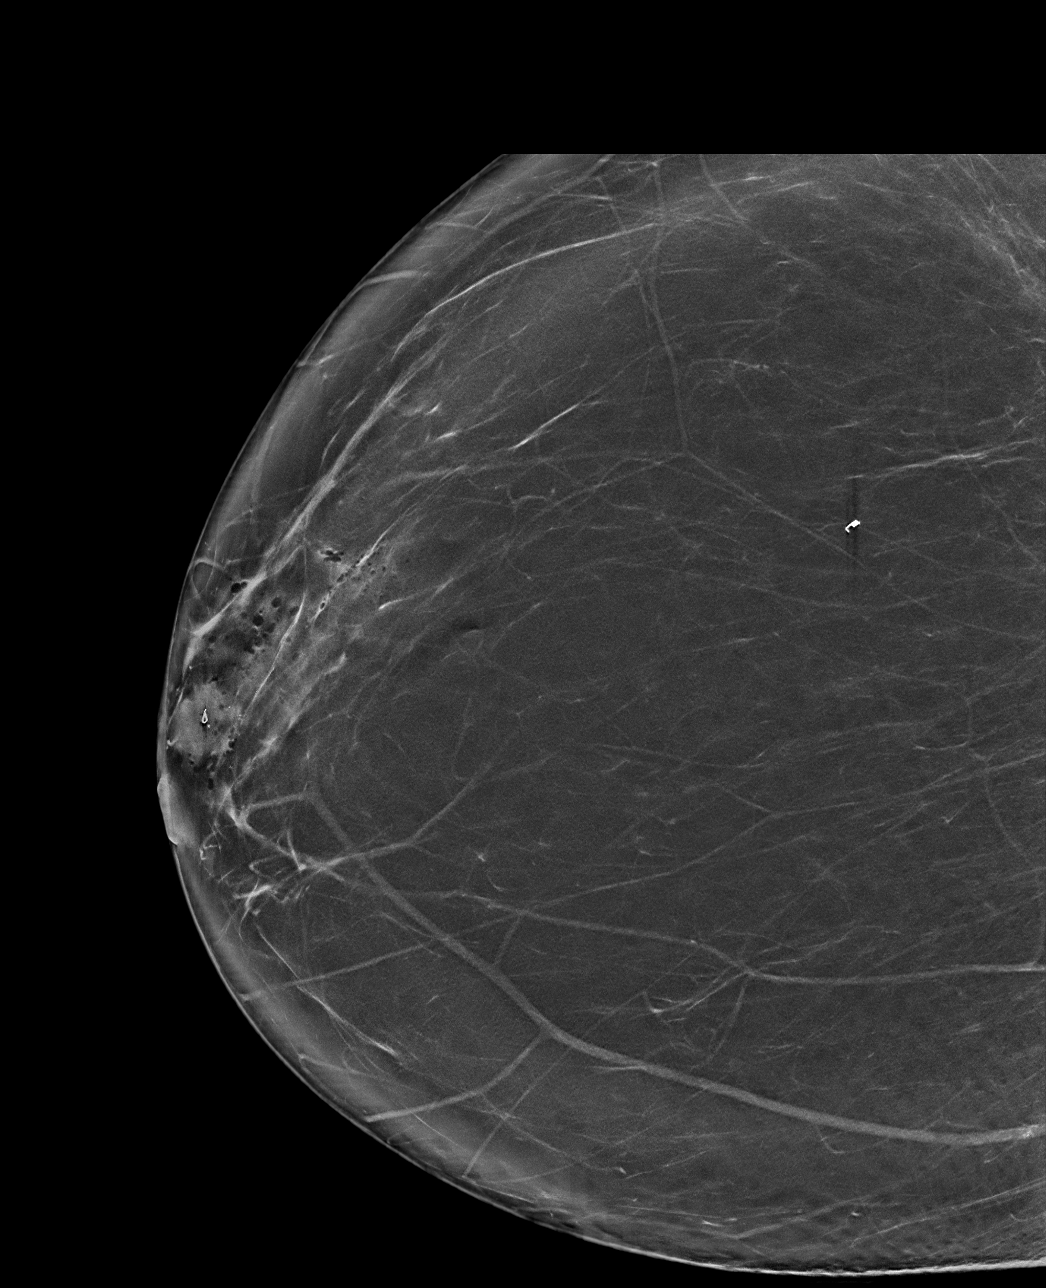

[R ML synth-2D]
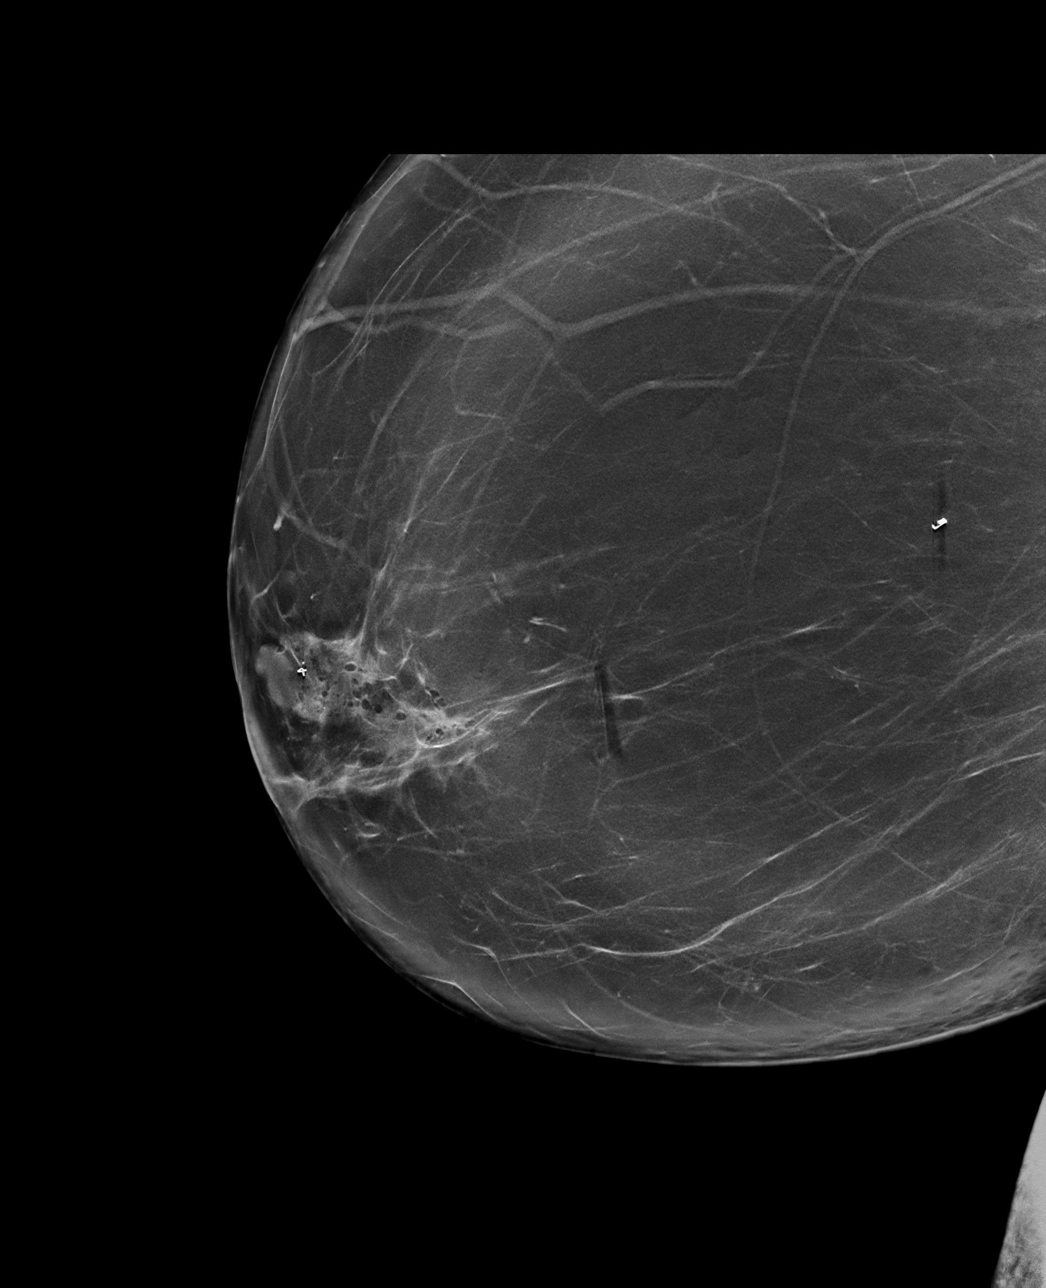

[R CC tomo · tomo slice 47/94.0]
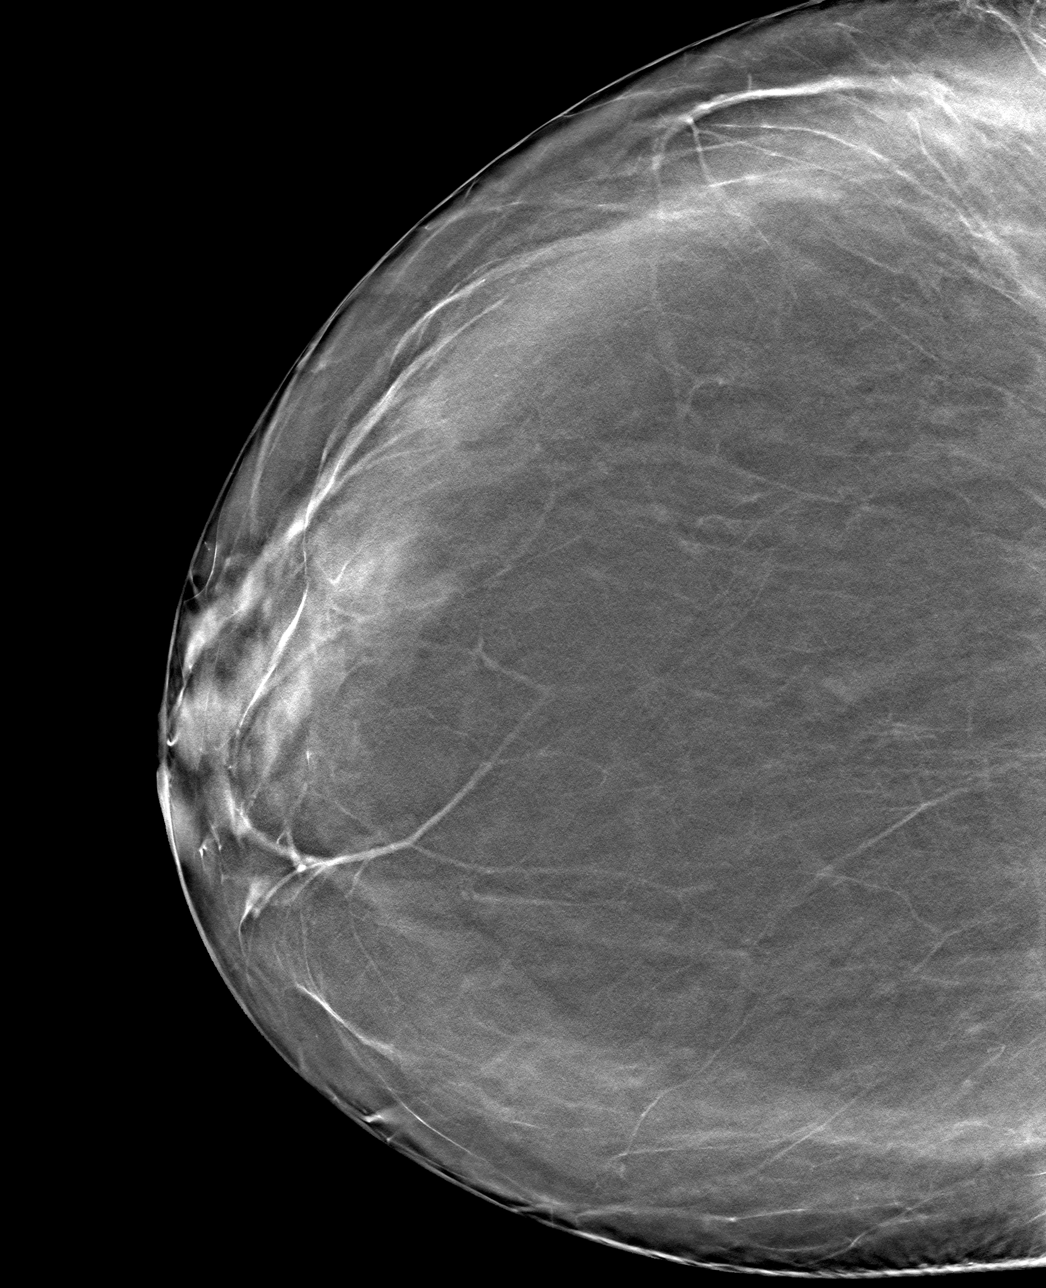

[R ML tomo · tomo slice 51/101.0]
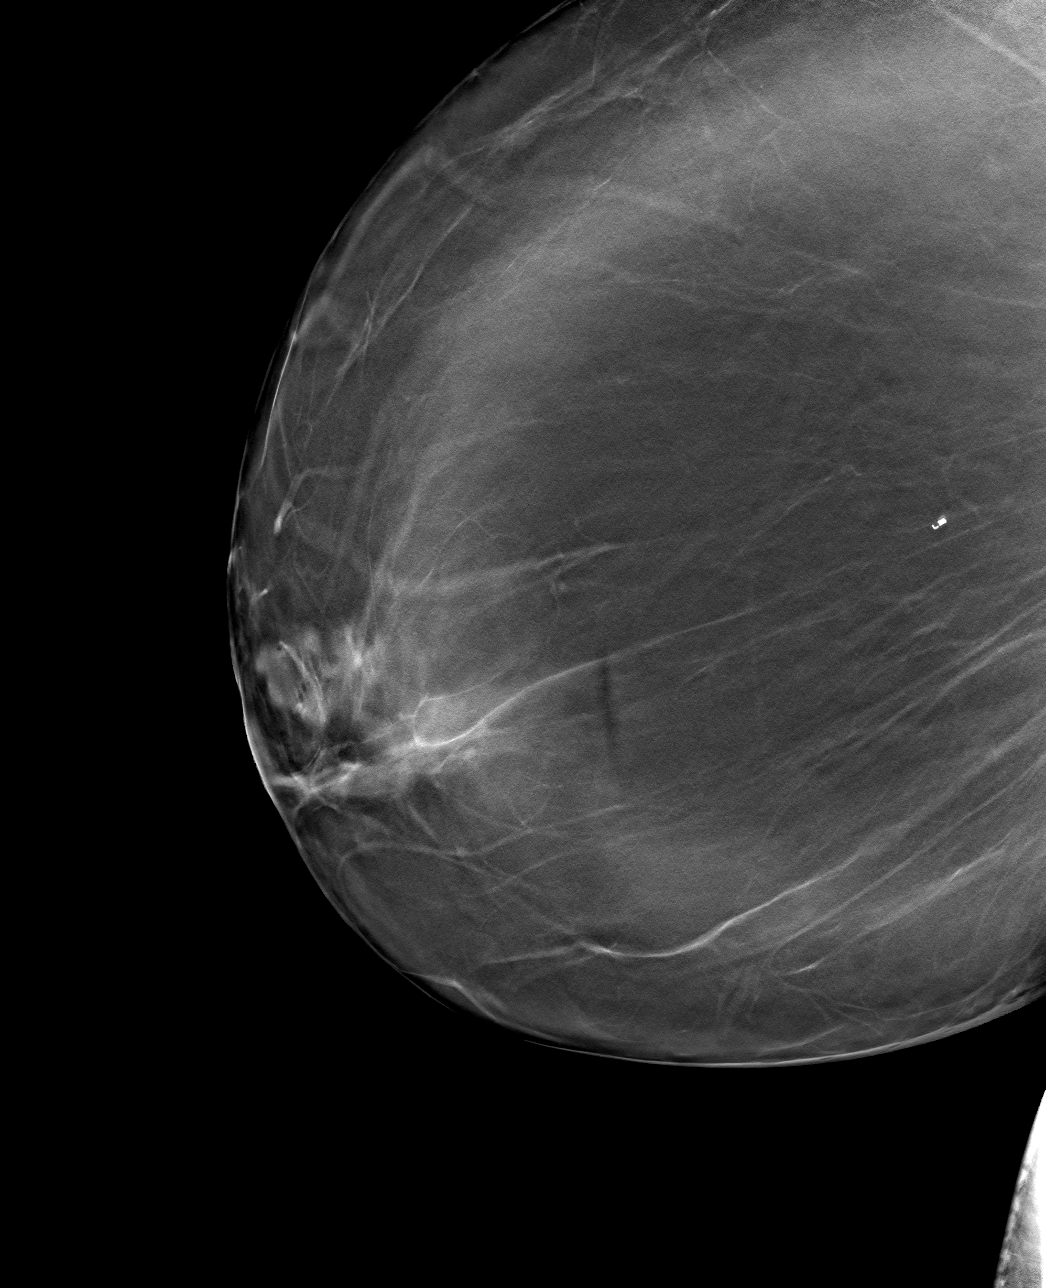

[4 of 12 positions shown; findings below may reference images not displayed]

FINDINGS: 3D Mammographic images were obtained following ultrasound guided
biopsy of a mass in the upper-outer quadrant of the right breast.
The biopsy marking clip is in expected location in the upper-outer
quadrant of the right breast.
IMPRESSION: Appropriate positioning of the ribbon shaped biopsy marking clip at
the site of biopsy in the upper-outer quadrant of the right breast.

Final Assessment: Post Procedure Mammograms for Marker Placement

## 2023-10-17 IMAGING — US US AXILLARY NODE CORE BIOPSY RIGHT
1 series · 10 of 10 positions shown · non-contrast
Comparison: Priors
COMPARISON: Priors

Addendum:
CLINICAL DATA: Patient with cortically thickened right axillary
lymph node.

EXAM:
US AXILLARY NODE CORE BIOPSY RIGHT

[Series 1: us axillary node core biopsy right · 0.09mm/px · 10 of 10 slices shown]
[im 1/10]
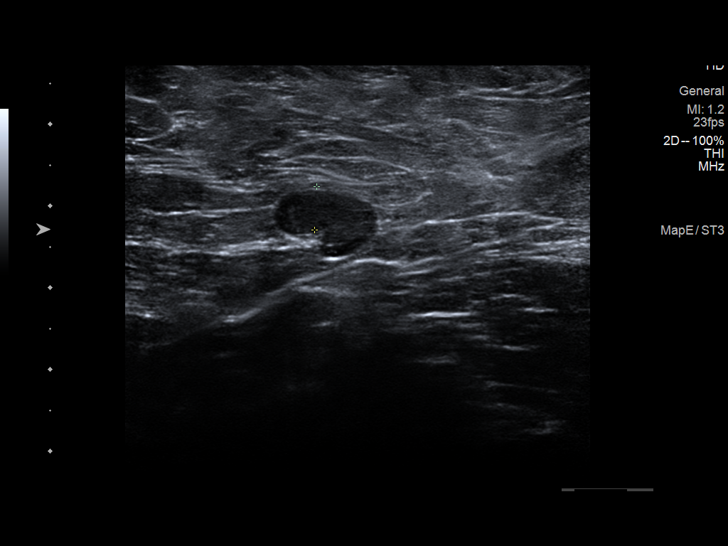
[im 2/10]
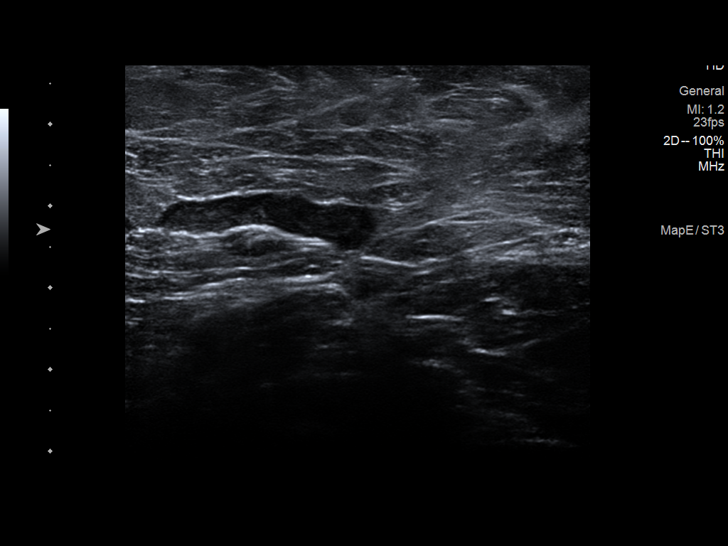
[im 3/10]
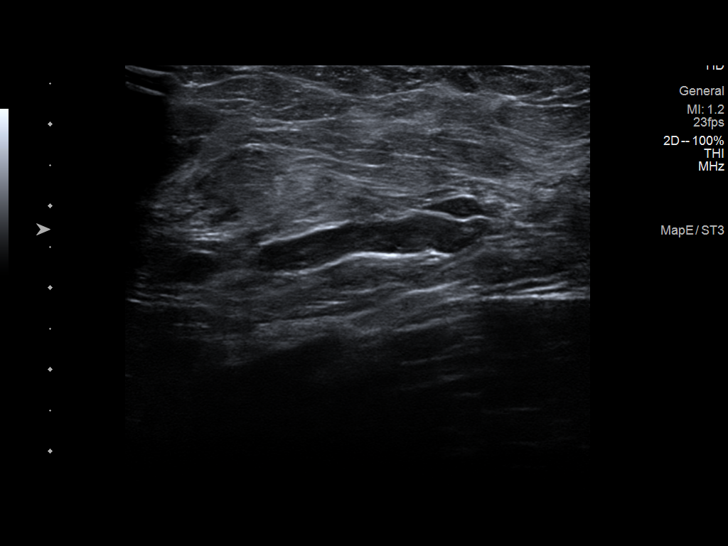
[im 4/10]
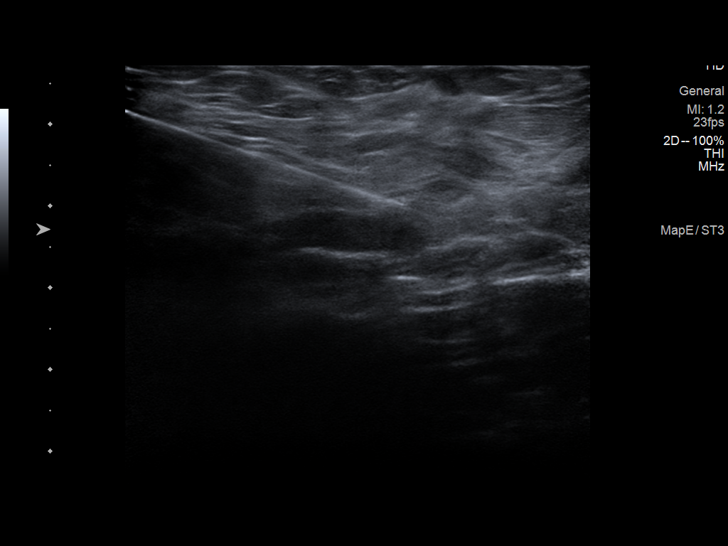
[im 5/10]
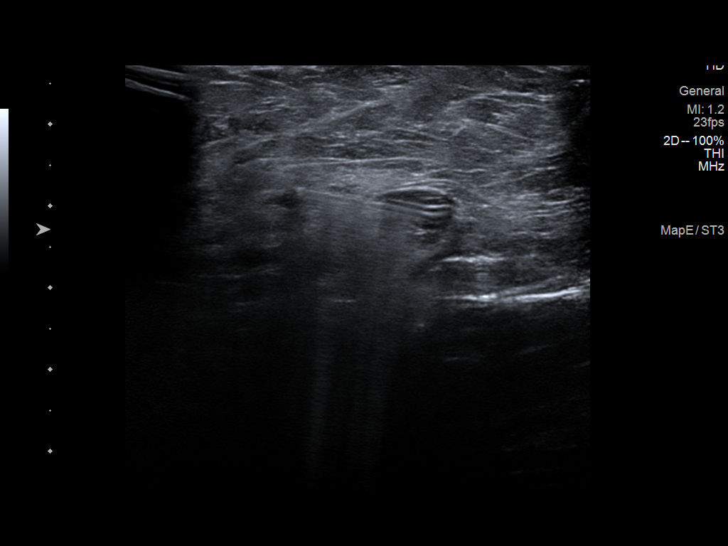
[im 6/10]
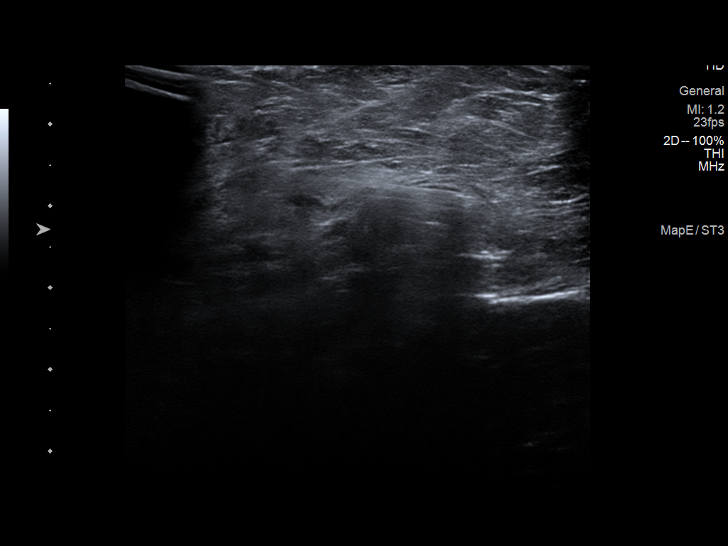
[im 7/10]
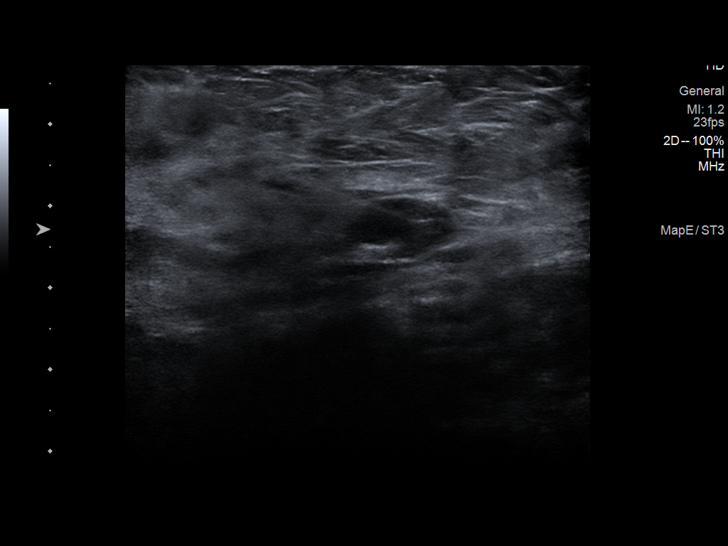
[im 8/10]
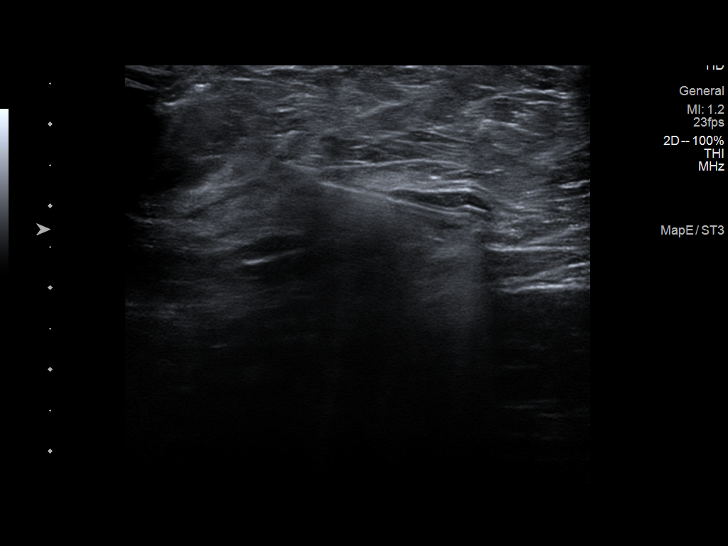
[im 9/10]
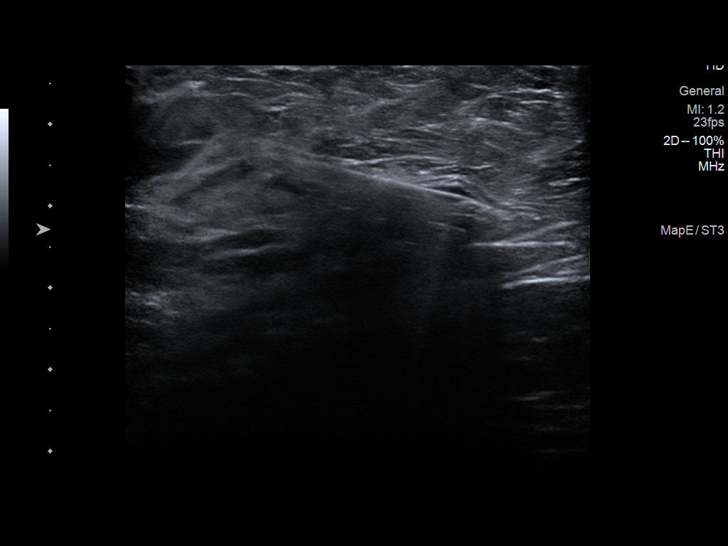
[im 10/10]
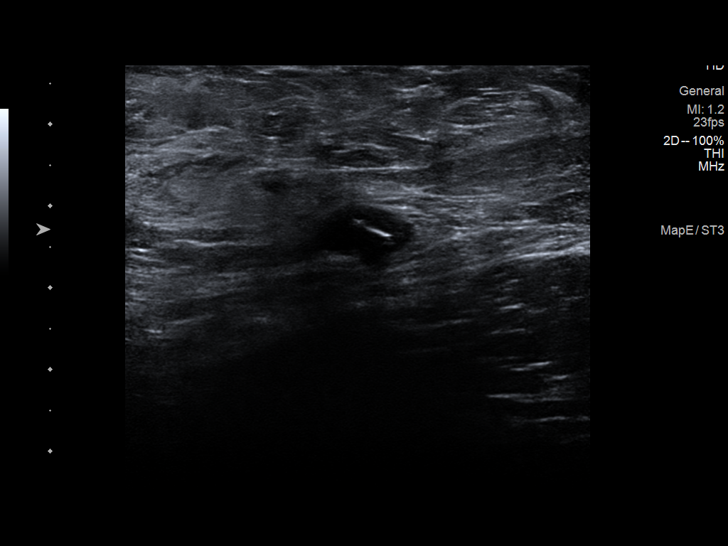

[10 of 10 positions shown; findings below may reference images not displayed]



Using sterile technique and 1% Lidocaine as local anesthetic, under
direct ultrasound visualization, a 14 gauge Celoi device was
used to perform biopsy of right axillary lymph node using a lateral
approach. At the conclusion of the procedure HydroMARK tissue marker
clip was deployed into the biopsy cavity.
IMPRESSION: Ultrasound guided biopsy of right axillary lymph node. No apparent
complications.

ADDENDUM:
Pathology revealed REACTIVE LYMPHOID HYPERPLASIA of the RIGHT
axilla, hydromark clip). Confirmatory flow cytometric analysis was
performed and shows predominance of T cells with nonspecific
changes. No monoclonal B-cell population identified. The overall
changes are nonspecific but consistent with reactive lymphoid
hyperplasia. This was found to be concordant by Dr. Haruka Darji.

Pathology results were discussed with the patient by telephone. The
patient reported doing well after the biopsy with tenderness at the
site. Post biopsy instructions and care were reviewed and questions
were answered. The patient was encouraged to call The [REDACTED] for any additional concerns. My direct phone
number was provided.

The patient has a recent diagnosis of RIGHT breast LOBULAR NEOPLASIA
(ATYPICAL LOBULAR HYPERPLASIA) and is scheduled to see Dr. Hankins
[place] at [REDACTED] on December 03, 2021.

Pathology results reported by Gabriel Sebastian Michue, RN on 12/02/2021.



Using sterile technique and 1% Lidocaine as local anesthetic, under
direct ultrasound visualization, a 14 gauge Celoi device was
used to perform biopsy of right axillary lymph node using a lateral
approach. At the conclusion of the procedure HydroMARK tissue marker
clip was deployed into the biopsy cavity.
IMPRESSION: Ultrasound guided biopsy of right axillary lymph node. No apparent
complications.

## 2023-11-01 ENCOUNTER — Ambulatory Visit
Admission: RE | Admit: 2023-11-01 | Discharge: 2023-11-01 | Disposition: A | Discharge status: Home / Self Care | Payer: Self-pay | Source: Ambulatory Visit | Attending: Obstetrics & Gynecology | Admitting: Obstetrics & Gynecology

## 2023-11-01 ENCOUNTER — Encounter: Payer: Self-pay | Admitting: Obstetrics & Gynecology

## 2023-11-01 DIAGNOSIS — N6310 Unspecified lump in the right breast, unspecified quadrant: Secondary | ICD-10-CM

## 2023-11-01 DIAGNOSIS — Z09 Encounter for follow-up examination after completed treatment for conditions other than malignant neoplasm: Secondary | ICD-10-CM
